# Patient Record
Sex: Female | Born: 1955 | Race: White | Hispanic: No | Smoking: Never smoker
Health system: Southern US, Community
[De-identification: ages and names within clinical notes are randomized; demographics above are authoritative.]

## PROBLEM LIST (undated history)

## (undated) DIAGNOSIS — M797 Fibromyalgia: Secondary | ICD-10-CM

## (undated) DIAGNOSIS — M1990 Unspecified osteoarthritis, unspecified site: Secondary | ICD-10-CM

## (undated) DIAGNOSIS — Z8489 Family history of other specified conditions: Secondary | ICD-10-CM

## (undated) DIAGNOSIS — F909 Attention-deficit hyperactivity disorder, unspecified type: Secondary | ICD-10-CM

## (undated) DIAGNOSIS — F419 Anxiety disorder, unspecified: Secondary | ICD-10-CM

## (undated) DIAGNOSIS — K219 Gastro-esophageal reflux disease without esophagitis: Secondary | ICD-10-CM

## (undated) DIAGNOSIS — F329 Major depressive disorder, single episode, unspecified: Secondary | ICD-10-CM

## (undated) DIAGNOSIS — M542 Cervicalgia: Secondary | ICD-10-CM

## (undated) DIAGNOSIS — M79604 Pain in right leg: Secondary | ICD-10-CM

## (undated) DIAGNOSIS — F32A Depression, unspecified: Secondary | ICD-10-CM

## (undated) DIAGNOSIS — M79605 Pain in left leg: Secondary | ICD-10-CM

## (undated) DIAGNOSIS — D649 Anemia, unspecified: Secondary | ICD-10-CM

## (undated) DIAGNOSIS — I10 Essential (primary) hypertension: Secondary | ICD-10-CM

## (undated) HISTORY — DX: Pain in right leg: M79.604

## (undated) HISTORY — PX: OTHER SURGICAL HISTORY: SHX169

## (undated) HISTORY — PX: NECK SURGERY: SHX720

## (undated) HISTORY — DX: Pain in right leg: M79.605

## (undated) HISTORY — PX: VAGINAL HYSTERECTOMY: SHX2639

## (undated) HISTORY — PX: DILATION AND CURETTAGE OF UTERUS: SHX78

## (undated) HISTORY — PX: ABDOMINAL HYSTERECTOMY: SHX81

## (undated) HISTORY — PX: TONSILLECTOMY: SUR1361

## (undated) HISTORY — PX: KNEE SURGERY: SHX244

---

## 1998-03-29 ENCOUNTER — Other Ambulatory Visit: Admission: RE | Admit: 1998-03-29 | Discharge: 1998-03-29 | Payer: Self-pay | Admitting: Obstetrics and Gynecology

## 1998-04-12 ENCOUNTER — Ambulatory Visit (HOSPITAL_COMMUNITY): Admission: RE | Admit: 1998-04-12 | Discharge: 1998-04-12 | Payer: Self-pay | Admitting: Obstetrics and Gynecology

## 1998-04-17 ENCOUNTER — Ambulatory Visit (HOSPITAL_COMMUNITY): Admission: RE | Admit: 1998-04-17 | Discharge: 1998-04-17 | Payer: Self-pay | Admitting: Obstetrics and Gynecology

## 1999-04-09 ENCOUNTER — Other Ambulatory Visit: Admission: RE | Admit: 1999-04-09 | Discharge: 1999-04-09 | Payer: Self-pay | Admitting: Obstetrics and Gynecology

## 1999-06-07 ENCOUNTER — Ambulatory Visit (HOSPITAL_COMMUNITY): Admission: RE | Admit: 1999-06-07 | Discharge: 1999-06-07 | Payer: Self-pay | Admitting: Obstetrics and Gynecology

## 1999-07-01 ENCOUNTER — Encounter: Admission: RE | Admit: 1999-07-01 | Discharge: 1999-07-18 | Payer: Self-pay | Admitting: Internal Medicine

## 1999-10-21 ENCOUNTER — Encounter: Admission: RE | Admit: 1999-10-21 | Discharge: 1999-10-21 | Payer: Self-pay | Admitting: Orthopedic Surgery

## 1999-11-07 ENCOUNTER — Ambulatory Visit (HOSPITAL_COMMUNITY): Admission: RE | Admit: 1999-11-07 | Discharge: 1999-11-07 | Payer: Self-pay | Admitting: Obstetrics and Gynecology

## 1999-11-07 ENCOUNTER — Encounter: Payer: Self-pay | Admitting: Obstetrics and Gynecology

## 1999-11-29 ENCOUNTER — Encounter: Payer: Self-pay | Admitting: Neurosurgery

## 1999-11-29 ENCOUNTER — Ambulatory Visit (HOSPITAL_COMMUNITY): Admission: RE | Admit: 1999-11-29 | Discharge: 1999-11-29 | Payer: Self-pay | Admitting: Neurosurgery

## 1999-12-17 ENCOUNTER — Ambulatory Visit (HOSPITAL_COMMUNITY): Admission: RE | Admit: 1999-12-17 | Discharge: 1999-12-17 | Payer: Self-pay | Admitting: Neurosurgery

## 1999-12-17 ENCOUNTER — Encounter: Payer: Self-pay | Admitting: Neurosurgery

## 2000-01-01 ENCOUNTER — Encounter: Payer: Self-pay | Admitting: Neurosurgery

## 2000-01-01 ENCOUNTER — Ambulatory Visit (HOSPITAL_COMMUNITY): Admission: RE | Admit: 2000-01-01 | Discharge: 2000-01-01 | Payer: Self-pay | Admitting: Neurosurgery

## 2000-01-14 ENCOUNTER — Ambulatory Visit (HOSPITAL_COMMUNITY): Admission: RE | Admit: 2000-01-14 | Discharge: 2000-01-14 | Payer: Self-pay | Admitting: Neurosurgery

## 2000-01-14 ENCOUNTER — Encounter: Payer: Self-pay | Admitting: Neurosurgery

## 2000-01-28 ENCOUNTER — Ambulatory Visit (HOSPITAL_COMMUNITY): Admission: RE | Admit: 2000-01-28 | Discharge: 2000-01-28 | Payer: Self-pay | Admitting: Internal Medicine

## 2000-01-28 ENCOUNTER — Encounter: Payer: Self-pay | Admitting: Internal Medicine

## 2000-06-04 ENCOUNTER — Ambulatory Visit (HOSPITAL_COMMUNITY): Admission: RE | Admit: 2000-06-04 | Discharge: 2000-06-04 | Payer: Self-pay | Admitting: Neurosurgery

## 2000-06-04 ENCOUNTER — Encounter: Payer: Self-pay | Admitting: Neurosurgery

## 2000-06-24 ENCOUNTER — Encounter: Payer: Self-pay | Admitting: Neurosurgery

## 2000-06-24 ENCOUNTER — Ambulatory Visit (HOSPITAL_COMMUNITY): Admission: RE | Admit: 2000-06-24 | Discharge: 2000-06-24 | Payer: Self-pay | Admitting: Neurosurgery

## 2000-06-25 ENCOUNTER — Emergency Department (HOSPITAL_COMMUNITY): Admission: EM | Admit: 2000-06-25 | Discharge: 2000-06-25 | Payer: Self-pay | Admitting: Emergency Medicine

## 2000-07-14 ENCOUNTER — Ambulatory Visit (HOSPITAL_COMMUNITY): Admission: RE | Admit: 2000-07-14 | Discharge: 2000-07-14 | Payer: Self-pay | Admitting: Neurosurgery

## 2000-07-14 ENCOUNTER — Encounter: Payer: Self-pay | Admitting: Neurosurgery

## 2000-09-17 ENCOUNTER — Encounter: Payer: Self-pay | Admitting: *Deleted

## 2000-09-17 ENCOUNTER — Ambulatory Visit (HOSPITAL_COMMUNITY): Admission: RE | Admit: 2000-09-17 | Discharge: 2000-09-17 | Payer: Self-pay | Admitting: *Deleted

## 2000-12-08 ENCOUNTER — Ambulatory Visit (HOSPITAL_COMMUNITY): Admission: RE | Admit: 2000-12-08 | Discharge: 2000-12-08 | Payer: Self-pay | Admitting: Obstetrics and Gynecology

## 2000-12-08 ENCOUNTER — Encounter: Payer: Self-pay | Admitting: Obstetrics and Gynecology

## 2001-01-19 ENCOUNTER — Ambulatory Visit (HOSPITAL_COMMUNITY): Admission: RE | Admit: 2001-01-19 | Discharge: 2001-01-19 | Payer: Self-pay | Admitting: Neurosurgery

## 2001-01-19 ENCOUNTER — Encounter: Payer: Self-pay | Admitting: Neurosurgery

## 2001-05-04 ENCOUNTER — Encounter: Payer: Self-pay | Admitting: Specialist

## 2001-05-04 ENCOUNTER — Ambulatory Visit (HOSPITAL_COMMUNITY): Admission: RE | Admit: 2001-05-04 | Discharge: 2001-05-05 | Payer: Self-pay | Admitting: Specialist

## 2001-06-17 ENCOUNTER — Ambulatory Visit (HOSPITAL_COMMUNITY): Admission: RE | Admit: 2001-06-17 | Discharge: 2001-06-17 | Payer: Self-pay | Admitting: Specialist

## 2001-06-17 ENCOUNTER — Encounter: Payer: Self-pay | Admitting: Specialist

## 2001-10-15 ENCOUNTER — Other Ambulatory Visit: Admission: RE | Admit: 2001-10-15 | Discharge: 2001-10-15 | Payer: Self-pay | Admitting: Obstetrics and Gynecology

## 2002-08-12 ENCOUNTER — Ambulatory Visit (HOSPITAL_COMMUNITY): Admission: RE | Admit: 2002-08-12 | Discharge: 2002-08-12 | Payer: Self-pay | Admitting: Obstetrics and Gynecology

## 2002-08-12 ENCOUNTER — Encounter: Payer: Self-pay | Admitting: Obstetrics and Gynecology

## 2003-03-29 ENCOUNTER — Other Ambulatory Visit: Admission: RE | Admit: 2003-03-29 | Discharge: 2003-03-29 | Payer: Self-pay | Admitting: Obstetrics and Gynecology

## 2003-10-20 ENCOUNTER — Encounter: Admission: RE | Admit: 2003-10-20 | Discharge: 2003-10-20 | Payer: Self-pay | Admitting: Neurosurgery

## 2003-11-07 ENCOUNTER — Encounter: Admission: RE | Admit: 2003-11-07 | Discharge: 2003-11-07 | Payer: Self-pay | Admitting: Neurosurgery

## 2003-11-29 ENCOUNTER — Encounter: Admission: RE | Admit: 2003-11-29 | Discharge: 2003-11-29 | Payer: Self-pay | Admitting: Neurosurgery

## 2004-01-10 ENCOUNTER — Ambulatory Visit (HOSPITAL_COMMUNITY): Admission: RE | Admit: 2004-01-10 | Discharge: 2004-01-10 | Payer: Self-pay | Admitting: Family Medicine

## 2006-04-01 ENCOUNTER — Encounter: Payer: Self-pay | Admitting: Orthopedic Surgery

## 2009-09-14 ENCOUNTER — Ambulatory Visit (HOSPITAL_COMMUNITY): Admission: RE | Admit: 2009-09-14 | Discharge: 2009-09-14 | Payer: Self-pay | Admitting: Family Medicine

## 2011-04-18 NOTE — Op Note (Signed)
[employer]. [facility]  Patient:    Whitney Gregory, Whitney Gregory                     MRN: [id number] Proc. Date: 06/04/00 Adm. Date:  [id number] Attending:  Gerald Dexter                           Operative Report  PREOPERATIVE DIAGNOSIS:  Herniated disk with spondylosis, C5-6.  POSTOPERATIVE DIAGNOSIS:  Herniated disk with spondylosis, C5-6.  PROCEDURE:  C5-6 anterior cervical diskectomy with tibial bone bank fusion with the operating microscope.  SURGEON:  Reinaldo Meeker, M.D.  ASSISTANT:  Donzetta Sprung. Roney Jaffe., M.D.  PROCEDURE IN DETAIL:  After being placed in the supine position with five pounds of halter traction, the patients neck was prepped and draped in the usual sterile fashion.  A localizing x-ray was taken prior to incision to identify the appropriate level.  A transverse incision was made at the right anterior neck starting at the midline, headed toward the medial aspect of the sternocleidomastoid muscle.  The platysma muscle was then incised transversely.  The natural fascial plane between the strap muscles medially and sternocleidomastoid laterally was identified and followed down to the anterior aspect of the cervical spine.  The longus colli muscles were identified, split in the midline, stripped away bilaterally, _____ Art therapist.  A second x-ray was taken at this time, which showed to the C6-7 level.  Dissection was carried out one level higher at C5-6.  Self-retaining retractors were placed for exposure.  Using a 15 blade, the annulus of the disk was incised.  Using pituitary rongeurs and curettes, a thorough disk space clean-out was carried out.  Microscope was draped and brought into the field and used for the remainder of the case.  Using microdissection technique, the remainder of the disk material out of the posterior longitudinal ligament was removed.  Some bony overgrowth was also removed. Posterior longitudinal ligament was then  incised transversely, and the cut edges were removed with the Kerrison punch.  Proximal foraminal decompression of the C6 nerve roots bilaterally was then carried out until there was no further evidence of compression.  At this point, inspection was carried out in all directions for any evidence of residual compression, and none could be identified.  Large amounts of irrigation were carried out, any bleeding controlled with bipolar coagulation and Gelfoam.  Measurements were taken, and a 7 mm bone bank plug was reconstituted.  After irrigating once more and confirming hemostasis, the bone plug was impacted without difficulty.  A final x-ray was taken, which showed good placement of the bony plug.  At this point, irrigation was carried out once more and hemostasis was confirmed.  The wound was closed using interrupted Vicryl on the platysma muscle, inverted 5-0 PDS in the subcuticular layer, and staples on the skin.  Sterile dressing was then applied.  The patient was reactivated and taken to the recovery room in stable condition. DD:  06/04/00 TD:  06/04/00 Job: [id number]

## 2011-04-18 NOTE — H&P (Signed)
[place]. [facility]  Patient:    Whitney Gregory, Whitney Gregory                    MRN: [id number] Adm. Date:  05/04/01 Attending:  Kerrin Champagne, M.D. Dictator:   Druscilla Brownie. Shela Nevin, P.A. CC:         Julieanne Manson, M.D. of [facility] [place]   History and Physical  DATE OF BIRTH:  Sep 02, 1956  CHIEF COMPLAINT:  "Pain in my neck and arms with tingling in my hands."  PRESENT ILLNESS:  This is 55 year old white female who is seen by [place] for continuing problems concerning her cervical spine.  She had an anterior diskectomy and fusion performed in 2001 but had continuing and residual pain into her neck and into her right shoulder.  She now has intermittent numbness in the hands.  Her previous surgery was by Dr. Aliene Beams at the C6-7 level.  She has a constant aching discomfort in the cervical spine and in the shoulder area.  The patient has limitation to range of motion with forward bending of the cervical spine is two fingerbreadths from the sternum. Extension is only about 30 degrees.  She has diminished lateral bending and rotation as well.  No motor deficits in the upper extremities.  X-rays showed attempted fusion at C5-6.  It was felt this patient would need a repeat anterior diskectomy and fusion using allograft.  She is scheduled for rearthrodesis at C5-6.  PAST MEDICAL HISTORY:  She has had a tonsillectomy, adenoidectomy, hysterectomy, and laparoscopic surgery in the past.  She currently is being checked closely for any type of hypertension by Dr. Delrae Alfred of [facility].  CURRENT MEDICATIONS: 1. Celexa 20 mg one q.d. 2. Temazepam 15 mg one h.s. 3. Alprazolam 0.5 mg one-half tablet p.r.n. 4. OxyContin 20 mg one t.i.d. for pain.  ALLERGIES:  SERZONE, which causes tachycardia.  FAMILY HISTORY:  Positive for hypertension.  SOCIAL HISTORY:  The patient has occasional glass of wine during the week.  No intake of tobacco  products.  REVIEW OF SYSTEMS:  CNS:  No seizure disorder, paralysis, double vision, but the patient does have radiculopathy from C5-6 in neck and upper extremities. CARDIOVASCULAR:  No chest pain, no angina, no orthopnea.  RESPIRATORY:  No productive cough, no hemoptysis, no shortness of breath.  GASTROINTESTINAL: No nausea, vomiting, melena, or blood stools.  GENITOURINARY:  No discharge, dysuria, or hematuria.  MUSCULOSKELETAL:  Primarily as in present illness with the cervical spine as mentioned above.  PHYSICAL EXAMINATION:  GENERAL:  Alert and cooperative, friendly, somewhat anxious 55 year old white female.  VITAL SIGNS:  Blood pressure 110/64, pulse 60, respirations 12.  HEENT:  Normocephalic. PERRLA, EOM intact.  Oropharynx is clear.  CHEST:  Clear to auscultation.  No rhonchi, no rales.  HEART:  Regular rate and rhythm.  No murmurs are heard.  ABDOMEN:  Soft, nontender.  Liver and spleen not felt.  GENITALIA/RECTAL/PELVIC/BREAST:  Not done, not pertinent to present illness.  EXTREMITIES:  Upper extremities as in present illness above.  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  PLAN:  The patient to undergo rearthrodesis C5-6 with allograft bone graft and ______ plates and screws. DD:  04/30/01 TD:  04/30/01 Job: [id number]

## 2011-04-18 NOTE — Op Note (Signed)
[place]. [facility]  Patient:    Whitney Gregory, Whitney Gregory                     MRN: [id number] Proc. Date: 05/04/01 Adm. Date:  [id number] Attending:  Lubertha South                           Operative Report  PREOPERATIVE DIAGNOSIS:  Pseudoarthrosis, C5-6, of an anterior cervical diskectomy and fusion site with kyphotic deformity and collapse of bone graft at this level.  POSTOPERATIVE DIAGNOSIS:  Pseudoarthrosis, C5-6, of an anterior cervical diskectomy and fusion site with kyphotic deformity and collapse of bone graft at this level.  PROCEDURES:  Takedown of C5-6 anterior cervical fusion and pseudoarthrosis and then re-arthrodesis using a 9 mm dense cancellous allograft bone graft and a 22 mm Spinal Concept plate with 13 mm screws.  SURGEON:  Kerrin Champagne, M.D.  ASSISTANT:  Georges Lynch. Darrelyn Hillock, M.D.  ANESTHESIA:  GOT, Kaylyn Layer. Michelle Piper, M.D.  ESTIMATED BLOOD LOSS:  50 cc.  DRAINS:  Foley to straight drain.  BRIEF CLINICAL HISTORY:  This patient is a 55 year old female who had a large C5-6 disk protrusion almost a year ago and was treated with anterior cervical diskectomy and fusion at the C5-6 level with allograft bone graft, without fixation.  She eventually went on to collapse her bone graft and develop pseudoarthrosis.  She has had persistent neck and shoulder pain since the time of surgery.  Numbness into her arms has improved immediately following her surgery.  The patient is brought to the operating room to undergo a re-arthrodesis, and this is to be done from the anterior aspect of the cervical spine in order to restore the normal lordotic curve due to bone graft collapse and a kyphotic deformity that has developed at the fusion site.  The patient has had persistent neck and shoulder pain since the time of her surgery requiring narcotic medications.  INTRAOPERATIVE FINDINGS:  The patient was found to have definite motion at the previous  fusion site at C5-6, collapse of the fusion graft, and an intraoperative appearance of kyphosis.  DESCRIPTION OF PROCEDURE:  After adequate general anesthesia, patient in beach chair position, the neck in slight extension with five pounds cervical Holter traction.  Standard prep with Duraprep solution over the anterior aspect of the neck on the right side.  Draped in the usual manner.  An iodine-impregnated Vi-Drape.  Standard preoperative antibiotics.  The incision through the old previous incision scar along the right side of the neck after infiltration with Marcaine 0.5% with 1:200,000 epinephrine.  Carried sharply through skin and subcutaneous layers down to the platysmal layer.  This was incised in line with the skin incision.  The incision was approximately 2-1/2 to 3 inches in length.  The interval between the trachea and esophagus medially and the carotid sheath laterally was then developed using blunt finger dissection to the anterior aspect of the cervical spine.  Exposure was excellent.  The medial border of longus colli muscle identified. Electrocautery then used to incise the prevertebral fascia along the medial border of the longus colli muscle, and the Key elevator then used to tease the prevertebral fascia across the midline, and the anterior aspect of the cervical spine was exposed.  Spinal needles were then placed with the sheath intact, only allowing a centimeter of the needle to protrude.  These were inserted into the disk space at  C6-7 and at C5-6.  Intraoperative radiograph obtained, which demonstrated the needle at the 5-6 level above and at 6-7 below.  Medial border of the longus colli muscle was then carefully freed up using a Art therapist.  A McCullough retractor was inserted.  This was done after removal of the spinal needles.  The upper needle after its removal was marked using the pituitary rongeur, removing bone graft from the C5-6 level. McCullough retractor  inserted without difficulty.  Drill holes then placed into the vertebral body of C5 and C6 anteriorly and distraction using Caspar distraction system.  Another operative radiograph was obtained in order to ensure that the proper disk space was present and then the disk space mobilized between the distraction screws nicely.  This was found to be the case at C5-6.  High-speed bur was then used to remove the fibrous area of the fusion and pseudoarthrosis at C5-6.  The graft was taken down and the superior cortical bone debrided off the end plate at C6 and off the inferior aspect of C5.  This was taken all the way back to the spinal canal.  When the graft was removed, then a 1 mm Kerrison could be introduced.  The posterior inferior lip of C5 was resected, removing a bony block within the spinal canal and decompressing the neural foramen, both the left side and the right side, removing uncovertebral spurs from the bilaterally.  Additionally, the uncovertebral joint on the left side involving the superior posterior lateral lip of C6 was resected, opening the neural foramen for the left C6 nerve root and similarly on the right side, opening the neural foramen for the C6 nerve root.  When this was completed, then irrigation was performed. High-speed bur was used to carefully bur the end plate superiorly inferiorly to establish an excellent bed for bone graft.  The depth of the intervertebral disk space was measured and measured about 15 mm.  Sounders were then used to sound the disk for height.  Initially an 8 mm, then a 9 mm sounder was inserted, and this appeared to be the best fit.  A 9 mm long graft was then brought onto the field and reactivated in saline solution.  Gelfoam, thrombin-soaked, was used to obtain hemostasis over the bleeding end plates both inferiorly and superiorly.  When this was adequately hemostased, then irrigation was performed.  Examination of the intervertebral disk  space demonstrated no active bleeding present.  Graft was then impacted into place and the distraction system then removed after the graft was countersunk about  1 mm.  The screw posts were removed at the C5 vertebral body and at C6.  Next, the high-speed bur was used to carefully bur the anterior lip of the end plate at Z6-1 and the intervertebral disk space at C5-6 anteriorly to accept the plate.  Bone wax was applied to the bleeding screw holes from the previous posts.  Carefully, the expected distance for the new plate was measured using a caliper.  A 24 mm plate was chosen.  The plate was then placed over the anterior aspect of the cervical spine using a plate holder and approximated the previous area of insertion of the patients distraction screws. Five-pound cervical Holter traction was released.  The plate was then impacted into place.  Then using a drill sleeve, 13 mm screws were then placed on the left side in the C5 vertebral body, the upper right screw hole, and then the lower right screw hole was similarly  drilled and the 13 mm screw placed. Regular screws were used.  These screws were then tightened into place after their initial application, ensuring the plate was in proper orientation in the AP plane.  After these screws were then placed, the inferior screw on the left side was then placed, and then the superior screw on the left side. Intraoperative radiograph was obtained, which demonstrated the screws and plates were in excellent position and alignment, graft distracting the disk space 9 mm, and restoring normal lordotic curve of the cervical spine. Irrigation was then performed.  The esophagus examined, and it had normal appearance.  Soft tissues allowed to fall back into place.  All area of bleeding was cauterized using bipolar electrocautery for the superficial layer of the incision.  The platysmal layer was then approximated with interrupted 3-0 Vicryl suture, the  deep layers of the skin approximated with interrupted 3-0 Vicryl sutures, and the skin closed with a running subcuticular stitch of 4-0 Vicryl.  Tincture of benzoin and Steri-Strips applied with a 4 x 4, affixed to the skin with Hypafix tape.  [place] collar applied.  Patient had a Foley catheter in place at the beginning of the case.  This was placed, and this was removed at the end of the case.  The patient was then reactivated and returned to the recovery room in satisfactory condition.  All instrument and sponge counts were correct. DD:  05/04/01 TD:  05/04/01 Job: [id number]

## 2011-06-21 ENCOUNTER — Emergency Department (HOSPITAL_BASED_OUTPATIENT_CLINIC_OR_DEPARTMENT_OTHER): Payer: Worker's Compensation

## 2011-06-21 ENCOUNTER — Emergency Department (HOSPITAL_BASED_OUTPATIENT_CLINIC_OR_DEPARTMENT_OTHER)
Admission: EM | Admit: 2011-06-21 | Discharge: 2011-06-21 | Disposition: A | Discharge status: Home / Self Care | Payer: Worker's Compensation | Attending: Emergency Medicine | Admitting: Emergency Medicine

## 2011-06-21 ENCOUNTER — Encounter: Payer: Self-pay | Admitting: Emergency Medicine

## 2011-06-21 DIAGNOSIS — Y9289 Other specified places as the place of occurrence of the external cause: Secondary | ICD-10-CM | POA: Insufficient documentation

## 2011-06-21 DIAGNOSIS — X58XXXA Exposure to other specified factors, initial encounter: Secondary | ICD-10-CM | POA: Insufficient documentation

## 2011-06-21 DIAGNOSIS — T148XXA Other injury of unspecified body region, initial encounter: Secondary | ICD-10-CM

## 2011-06-21 HISTORY — DX: Fibromyalgia: M79.7

## 2011-06-21 HISTORY — DX: Unspecified osteoarthritis, unspecified site: M19.90

## 2011-06-21 NOTE — ED Provider Notes (Signed)
History    pt injured her left knee at work when stepping off an object, felt a pop, able to walk but with pain--no prior h/o injury--pain better with rest--no tx used prior to arrival  Chief Complaint  Patient presents with  . Knee Injury   The history is provided by the patient.    Past Medical History  Diagnosis Date  . Fibromyalgia   . Arthritis     Past Surgical History  Procedure Date  . Abdominal hysterectomy   . Tonsillectomy   . Neck surgery     No family history on file.  History  Substance Use Topics  . Smoking status: Not on file  . Smokeless tobacco: Not on file  . Alcohol Use:     OB History    Grav Para Term Preterm Abortions TAB SAB Ect Mult Living                  Review of Systems  All other systems reviewed and are negative.    Physical Exam  BP 145/84  Pulse 66  Temp(Src) 98.5 F (36.9 C) (Oral)  Resp 18  SpO2 98%  Physical Exam  Nursing note and vitals reviewed. Constitutional: She is oriented to person, place, and time. She appears well-developed and well-nourished.  Non-toxic appearance.  HENT:  Head: Normocephalic and atraumatic.  Eyes: Conjunctivae are normal. Pupils are equal, round, and reactive to light.  Neck: Normal range of motion.  Cardiovascular: Normal rate.   Pulmonary/Chest: Effort normal.  Musculoskeletal:       Left knee: She exhibits no swelling, no effusion and no deformity.       Legs: Neurological: She is alert and oriented to person, place, and time.  Skin: Skin is warm and dry.  Psychiatric: She has a normal mood and affect.    ED Course  Procedures  MDM     Pt doesn't want the xray, crutches and knee brace given with ortho referral  Toy Baker, MD 06/21/11 0147

## 2011-06-21 NOTE — ED Notes (Signed)
Pt c/o of left knee pain after stepping on stool.

## 2012-03-31 DIAGNOSIS — Z96652 Presence of left artificial knee joint: Secondary | ICD-10-CM | POA: Insufficient documentation

## 2012-04-15 ENCOUNTER — Encounter (HOSPITAL_COMMUNITY): Payer: Self-pay | Admitting: Pharmacy Technician

## 2012-04-19 ENCOUNTER — Encounter (HOSPITAL_COMMUNITY)
Admission: RE | Admit: 2012-04-19 | Discharge: 2012-04-19 | Disposition: A | Discharge status: Home / Self Care | Payer: Worker's Compensation | Source: Ambulatory Visit | Attending: Orthopedic Surgery | Admitting: Orthopedic Surgery

## 2012-04-19 ENCOUNTER — Encounter (HOSPITAL_COMMUNITY): Payer: Self-pay

## 2012-04-19 HISTORY — DX: Depression, unspecified: F32.A

## 2012-04-19 HISTORY — DX: Anxiety disorder, unspecified: F41.9

## 2012-04-19 HISTORY — DX: Gastro-esophageal reflux disease without esophagitis: K21.9

## 2012-04-19 HISTORY — DX: Anemia, unspecified: D64.9

## 2012-04-19 HISTORY — DX: Major depressive disorder, single episode, unspecified: F32.9

## 2012-04-19 LAB — DIFFERENTIAL
Basophils Relative: 0 % (ref 0–1)
Eosinophils Absolute: 0.1 10*3/uL (ref 0.0–0.7)
Eosinophils Relative: 2 % (ref 0–5)
Lymphs Abs: 2.1 10*3/uL (ref 0.7–4.0)
Neutrophils Relative %: 52 % (ref 43–77)

## 2012-04-19 LAB — URINALYSIS, ROUTINE W REFLEX MICROSCOPIC
Bilirubin Urine: NEGATIVE
Hgb urine dipstick: NEGATIVE
Ketones, ur: NEGATIVE mg/dL
Nitrite: NEGATIVE
Specific Gravity, Urine: 1.018 (ref 1.005–1.030)
Urobilinogen, UA: 0.2 mg/dL (ref 0.0–1.0)

## 2012-04-19 LAB — CBC
MCH: 30.9 pg (ref 26.0–34.0)
MCHC: 33.5 g/dL (ref 30.0–36.0)
MCV: 92.3 fL (ref 78.0–100.0)
Platelets: 316 10*3/uL (ref 150–400)
RBC: 4.43 MIL/uL (ref 3.87–5.11)
RDW: 12.4 % (ref 11.5–15.5)

## 2012-04-19 LAB — BASIC METABOLIC PANEL
BUN: 23 mg/dL (ref 6–23)
CO2: 27 mEq/L (ref 19–32)
Calcium: 9.3 mg/dL (ref 8.4–10.5)
Creatinine, Ser: 0.6 mg/dL (ref 0.50–1.10)
GFR calc non Af Amer: >90 mL/min (ref >90)
Glucose, Bld: 101 mg/dL — ABNORMAL HIGH (ref 70–99)
Sodium: 138 mEq/L (ref 135–145)

## 2012-04-19 LAB — PROTIME-INR
INR: 0.89 (ref 0.00–1.49)
Prothrombin Time: 12.2 seconds (ref 11.6–15.2)

## 2012-04-19 LAB — SURGICAL PCR SCREEN: Staphylococcus aureus: NEGATIVE

## 2012-04-19 NOTE — Patient Instructions (Signed)
20 Whitney Gregory  04/19/2012   Your procedure is scheduled on:  04/27/12 400pm-530pm  Report to [facility] at 130pm  Call this number if you have problems the morning of surgery: [phone]   Remember:   Do not eat food:After Midnight.  May have clear liquids:until Midnight   Take these medicines the morning of surgery with A SIP OF WATER:    Do not wear jewelry.  Do not wear lotions, powders, or perfumes.   Do not shave 48 hours prior to surgery. Men may shave face and neck.  Do not bring valuables to the hospital.  Contacts, dentures or bridgework may not be worn into surgery.  Leave suitcase in the car. After surgery it may be brought to your room.  For patients admitted to the hospital, checkout time is 11:00 AM the day of discharge.      Special Instructions: CHG Shower Use Special Wash: 1/2 bottle night before surgery and 1/2 bottle morning of surgery. shower chin to toes with CHG.  Wash face and private parts with regular soap.    Please read over the following fact sheets that you were given: MRSA Information, coughing and deep breathing exercises, leg exercises, Incentive Spirometry Fact Sheet, Blood Transfusion Fact Sheet

## 2012-04-20 NOTE — Pre-Procedure Instructions (Signed)
04/20/12 Pt takes a dissolvable multivitamin she dissolves in water and drinks.  [facility] does not have this listed on medications. Clarified oxycontin dosage and wellbutrin dosage with [employer].

## 2012-04-20 NOTE — Pre-Procedure Instructions (Signed)
04/20/12 received clearance note from MD and placed on chart.

## 2012-04-21 NOTE — H&P (Signed)
Whitney Gregory is an 56 y.o. female.    Chief Complaint: Left knee medial compartment OA and pain   HPI: Pt is a 56 y.o. female complaining of medial aspect left knee pain since July 2012.  Pain started with an injury while at work. She had a previous arthroscopy which did nothing to relieve the pain.  Do to continued pain and failure of conservative treatments including steroid injections and Euflexxa injections she would like to proceed with more definitive treatment.  X-rays in the clinic showed arthritic changes and an MRI revealed a osteochondral defect on the medial aspect of the left knee.  Various options are discussed with the patient. Risks, benefits and expectations were discussed with the patient. Patient understand the risks, benefits and expectations and wishes to proceed with surgery.   PCP:  Cala Bradford, MD, MD  D/C Plans:  Home with HHPT  Post-op Meds:  Rx given for ASA, Robaxin, Celebrex, Iron, Colace and MiraLax  Tranexamic Acid:   To be given  Decadron:   To be given  PMH: Past Medical History  Diagnosis Date  . Fibromyalgia   . Arthritis   . Anemia     hx of   . GERD (gastroesophageal reflux disease)     occasional   . Anxiety   . Depression     PSH: Past Surgical History  Procedure Date  . Abdominal hysterectomy   . Tonsillectomy   . Neck surgery   . Dilation and curettage of uterus   . Other surgical history     lapararoscopies for endometriosis     Social History:  reports that she has never smoked. She has never used smokeless tobacco. She reports that she drinks about 1.2 ounces of alcohol per week. She reports that she does not use illicit drugs.  Allergies:  No Known Allergies  Medications: Current Outpatient Prescriptions  Medication Sig Dispense Refill  . ALPRAZolam (XANAX) 0.25 MG tablet Take 0.25 mg by mouth at bedtime as needed. For  Sleep       . Black Cohosh 540 MG CAPS Take 1 capsule by mouth at bedtime.      Marland Kitchen buPROPion  (WELLBUTRIN) 100 MG tablet Take 300 mg by mouth daily.       . celecoxib (CELEBREX) 200 MG capsule Take 200 mg by mouth at bedtime.      Marland Kitchen FLUoxetine (PROZAC) 20 MG capsule Take 20 mg by mouth daily with breakfast.       . oxyCODONE (OXYCONTIN) 10 MG 12 hr tablet Take 20 mg by mouth every 12 (twelve) hours.       . temazepam (RESTORIL) 22.5 MG capsule Take 22.5 mg by mouth at bedtime as needed. For sleep        ROS: Review of Systems  Constitutional: Positive for malaise/fatigue.  Eyes: Negative.   Respiratory: Negative.   Cardiovascular: Negative.   Gastrointestinal: Positive for heartburn.  Genitourinary: Positive for urgency and frequency.  Musculoskeletal: Positive for myalgias, back pain and joint pain.  Skin: Negative.   Neurological: Positive for headaches.  Endo/Heme/Allergies: Negative.   Psychiatric/Behavioral: The patient is nervous/anxious.      Physical Exam: Vitals: BP:139/86 ; HR: 100 ; Resp: 16 Physical Exam  Constitutional: She is oriented to person, place, and time and well-developed, well-nourished, and in no distress.  HENT:  Head: Normocephalic and atraumatic.  Nose: Nose normal.  Mouth/Throat: Oropharynx is clear and moist.  Eyes: Pupils are equal, round, and reactive to light.  Neck: Neck supple. No JVD present. No tracheal deviation present. No thyromegaly present.  Cardiovascular: Normal rate, regular rhythm, normal heart sounds and intact distal pulses.   Pulmonary/Chest: Effort normal and breath sounds normal. No respiratory distress. She has no wheezes. She exhibits no tenderness.  Abdominal: Soft. There is no tenderness. There is no guarding.  Musculoskeletal:       Left knee: She exhibits decreased range of motion (0-120), swelling and bony tenderness. She exhibits no effusion, no ecchymosis, no deformity and no laceration. tenderness found. Medial joint line tenderness noted. No lateral joint line and no patellar tendon tenderness noted.    Lymphadenopathy:    She has no cervical adenopathy.  Neurological: She is alert and oriented to person, place, and time.  Skin: Skin is warm and dry.  Psychiatric: Affect normal.     Assessment/Plan Assessment: Left knee medial compartment OA and pain   Plan: Patient will undergo a left knee medial unilateral knee arthroplasty on 04/27/2012. Risks benefits and expectation were discussed with the patient. Patient understand risks, benefits and expectation and wishes to proceed.   Anastasio Auerbach Rollo Farquhar   PAC  04/21/2012, 4:45 PM

## 2012-04-27 ENCOUNTER — Encounter (HOSPITAL_COMMUNITY): Payer: Self-pay

## 2012-04-27 ENCOUNTER — Encounter (HOSPITAL_COMMUNITY): Payer: Self-pay | Admitting: *Deleted

## 2012-04-27 ENCOUNTER — Encounter (HOSPITAL_COMMUNITY)
Admission: RE | Disposition: A | Discharge status: Home / Self Care | Payer: Self-pay | Source: Ambulatory Visit | Attending: Orthopedic Surgery

## 2012-04-27 ENCOUNTER — Encounter (HOSPITAL_COMMUNITY): Payer: Self-pay | Admitting: Anesthesiology

## 2012-04-27 ENCOUNTER — Observation Stay (HOSPITAL_COMMUNITY)
Admission: RE | Admit: 2012-04-27 | Discharge: 2012-04-28 | Disposition: A | Discharge status: Home / Self Care | Payer: Worker's Compensation | Source: Ambulatory Visit | Attending: Orthopedic Surgery | Admitting: Orthopedic Surgery

## 2012-04-27 ENCOUNTER — Ambulatory Visit (HOSPITAL_COMMUNITY): Payer: Worker's Compensation | Admitting: Anesthesiology

## 2012-04-27 DIAGNOSIS — R3915 Urgency of urination: Secondary | ICD-10-CM | POA: Insufficient documentation

## 2012-04-27 DIAGNOSIS — R51 Headache: Secondary | ICD-10-CM | POA: Insufficient documentation

## 2012-04-27 DIAGNOSIS — R5383 Other fatigue: Secondary | ICD-10-CM | POA: Insufficient documentation

## 2012-04-27 DIAGNOSIS — R350 Frequency of micturition: Secondary | ICD-10-CM | POA: Insufficient documentation

## 2012-04-27 DIAGNOSIS — Z79899 Other long term (current) drug therapy: Secondary | ICD-10-CM | POA: Insufficient documentation

## 2012-04-27 DIAGNOSIS — R5381 Other malaise: Secondary | ICD-10-CM | POA: Insufficient documentation

## 2012-04-27 DIAGNOSIS — K219 Gastro-esophageal reflux disease without esophagitis: Secondary | ICD-10-CM | POA: Insufficient documentation

## 2012-04-27 DIAGNOSIS — M549 Dorsalgia, unspecified: Secondary | ICD-10-CM | POA: Insufficient documentation

## 2012-04-27 DIAGNOSIS — Z01812 Encounter for preprocedural laboratory examination: Secondary | ICD-10-CM | POA: Insufficient documentation

## 2012-04-27 DIAGNOSIS — Z96652 Presence of left artificial knee joint: Secondary | ICD-10-CM

## 2012-04-27 DIAGNOSIS — M1710 Unilateral primary osteoarthritis, unspecified knee: Principal | ICD-10-CM | POA: Insufficient documentation

## 2012-04-27 DIAGNOSIS — IMO0001 Reserved for inherently not codable concepts without codable children: Secondary | ICD-10-CM | POA: Insufficient documentation

## 2012-04-27 HISTORY — PX: PARTIAL KNEE ARTHROPLASTY: SHX2174

## 2012-04-27 LAB — ABO/RH: ABO/RH(D): O NEG

## 2012-04-27 LAB — TYPE AND SCREEN: ABO/RH(D): O NEG

## 2012-04-27 SURGERY — ARTHROPLASTY, KNEE, UNICOMPARTMENTAL
Anesthesia: Spinal | Site: Knee | Laterality: Left | Wound class: Clean

## 2012-04-27 MED ORDER — METHOCARBAMOL 500 MG PO TABS
500.0000 mg | ORAL_TABLET | Freq: Four times a day (QID) | ORAL | Status: DC | PRN
  Filled 2012-04-27: qty 1

## 2012-04-27 MED ORDER — TEMAZEPAM 7.5 MG PO CAPS: 22.5000 mg | ORAL_CAPSULE | Freq: Every evening | ORAL | Status: DC | PRN

## 2012-04-27 MED ORDER — MENTHOL 3 MG MT LOZG
1.0000 | LOZENGE | OROMUCOSAL | Status: DC | PRN
  Filled 2012-04-27: qty 9

## 2012-04-27 MED ORDER — BUPROPION HCL 100 MG PO TABS
300.0000 mg | ORAL_TABLET | Freq: Every day | ORAL | Status: DC
  Administered 2012-04-28: 300 mg via ORAL
  Filled 2012-04-27: qty 3

## 2012-04-27 MED ORDER — BUPIVACAINE-EPINEPHRINE PF 0.25-1:200000 % IJ SOLN
INTRAMUSCULAR | Status: AC
  Filled 2012-04-27: qty 60

## 2012-04-27 MED ORDER — FENTANYL CITRATE 0.05 MG/ML IJ SOLN
INTRAMUSCULAR | Status: DC | PRN
  Administered 2012-04-27: 100 ug via INTRAVENOUS

## 2012-04-27 MED ORDER — CHLORHEXIDINE GLUCONATE 4 % EX LIQD
60.0000 mL | Freq: Once | CUTANEOUS | Status: DC
  Filled 2012-04-27: qty 60

## 2012-04-27 MED ORDER — PROPOFOL 10 MG/ML IV EMUL
INTRAVENOUS | Status: DC | PRN
  Administered 2012-04-27: 120 ug/kg/min via INTRAVENOUS

## 2012-04-27 MED ORDER — METOCLOPRAMIDE HCL 5 MG/ML IJ SOLN: 5.0000 mg | Freq: Three times a day (TID) | INTRAMUSCULAR | Status: DC | PRN

## 2012-04-27 MED ORDER — ACETAMINOPHEN 325 MG PO TABS
650.0000 mg | ORAL_TABLET | Freq: Four times a day (QID) | ORAL | Status: DC | PRN
  Administered 2012-04-28 (×2): 650 mg via ORAL
  Filled 2012-04-27 (×2): qty 2

## 2012-04-27 MED ORDER — FLEET ENEMA 7-19 GM/118ML RE ENEM: 1.0000 | ENEMA | Freq: Once | RECTAL | Status: AC | PRN

## 2012-04-27 MED ORDER — DEXAMETHASONE SODIUM PHOSPHATE 10 MG/ML IJ SOLN
INTRAMUSCULAR | Status: DC | PRN
  Administered 2012-04-27: 10 mg via INTRAVENOUS

## 2012-04-27 MED ORDER — ACETAMINOPHEN 650 MG RE SUPP: 650.0000 mg | Freq: Four times a day (QID) | RECTAL | Status: DC | PRN

## 2012-04-27 MED ORDER — ONDANSETRON HCL 4 MG/2ML IJ SOLN: 4.0000 mg | Freq: Four times a day (QID) | INTRAMUSCULAR | Status: DC | PRN

## 2012-04-27 MED ORDER — PROMETHAZINE HCL 25 MG/ML IJ SOLN: 6.2500 mg | INTRAMUSCULAR | Status: DC | PRN

## 2012-04-27 MED ORDER — DOCUSATE SODIUM 100 MG PO CAPS
100.0000 mg | ORAL_CAPSULE | Freq: Two times a day (BID) | ORAL | Status: DC
  Administered 2012-04-27 – 2012-04-28 (×2): 100 mg via ORAL

## 2012-04-27 MED ORDER — ACETAMINOPHEN 10 MG/ML IV SOLN
INTRAVENOUS | Status: AC
  Filled 2012-04-27: qty 100

## 2012-04-27 MED ORDER — CEFAZOLIN SODIUM 1-5 GM-% IV SOLN
1.0000 g | INTRAVENOUS | Status: AC
  Administered 2012-04-27: 1 g via INTRAVENOUS

## 2012-04-27 MED ORDER — ALPRAZOLAM 0.25 MG PO TABS
0.2500 mg | ORAL_TABLET | Freq: Every evening | ORAL | Status: DC | PRN
  Administered 2012-04-27: 0.25 mg via ORAL
  Filled 2012-04-27: qty 1

## 2012-04-27 MED ORDER — POTASSIUM CHLORIDE 2 MEQ/ML IV SOLN
INTRAVENOUS | Status: DC
  Administered 2012-04-27: via INTRAVENOUS
  Filled 2012-04-27 (×3): qty 1000

## 2012-04-27 MED ORDER — HYDROMORPHONE HCL PF 1 MG/ML IJ SOLN
0.5000 mg | INTRAMUSCULAR | Status: DC | PRN
  Administered 2012-04-27 – 2012-04-28 (×2): 1 mg via INTRAVENOUS
  Filled 2012-04-27 (×2): qty 1

## 2012-04-27 MED ORDER — ACETAMINOPHEN 10 MG/ML IV SOLN
INTRAVENOUS | Status: DC | PRN
  Administered 2012-04-27: 1000 mg via INTRAVENOUS

## 2012-04-27 MED ORDER — ONDANSETRON HCL 4 MG PO TABS: 4.0000 mg | ORAL_TABLET | Freq: Four times a day (QID) | ORAL | Status: DC | PRN

## 2012-04-27 MED ORDER — RIVAROXABAN 10 MG PO TABS
10.0000 mg | ORAL_TABLET | ORAL | Status: DC
  Administered 2012-04-28: 10 mg via ORAL
  Filled 2012-04-27 (×2): qty 1

## 2012-04-27 MED ORDER — HYDROMORPHONE HCL PF 1 MG/ML IJ SOLN: 0.2500 mg | INTRAMUSCULAR | Status: DC | PRN

## 2012-04-27 MED ORDER — ONDANSETRON HCL 4 MG/2ML IJ SOLN
INTRAMUSCULAR | Status: DC | PRN
  Administered 2012-04-27: 4 mg via INTRAVENOUS

## 2012-04-27 MED ORDER — METHOCARBAMOL 100 MG/ML IJ SOLN
500.0000 mg | Freq: Four times a day (QID) | INTRAVENOUS | Status: DC | PRN
  Administered 2012-04-27 (×2): 500 mg via INTRAVENOUS
  Filled 2012-04-27 (×3): qty 5

## 2012-04-27 MED ORDER — CEFAZOLIN SODIUM 1-5 GM-% IV SOLN
1.0000 g | Freq: Four times a day (QID) | INTRAVENOUS | Status: AC
  Administered 2012-04-27 – 2012-04-28 (×3): 1 g via INTRAVENOUS
  Filled 2012-04-27 (×3): qty 50

## 2012-04-27 MED ORDER — BUPIVACAINE-EPINEPHRINE 0.25% -1:200000 IJ SOLN
INTRAMUSCULAR | Status: DC | PRN
  Administered 2012-04-27: 30 mL

## 2012-04-27 MED ORDER — CEFAZOLIN SODIUM 1-5 GM-% IV SOLN
INTRAVENOUS | Status: AC
  Filled 2012-04-27: qty 50

## 2012-04-27 MED ORDER — DIPHENHYDRAMINE HCL 25 MG PO CAPS: 25.0000 mg | ORAL_CAPSULE | Freq: Four times a day (QID) | ORAL | Status: DC | PRN

## 2012-04-27 MED ORDER — BUPIVACAINE HCL (PF) 0.5 % IJ SOLN
INTRAMUSCULAR | Status: DC | PRN
  Administered 2012-04-27: 3 mL

## 2012-04-27 MED ORDER — BISACODYL 5 MG PO TBEC: 5.0000 mg | DELAYED_RELEASE_TABLET | Freq: Every day | ORAL | Status: DC | PRN

## 2012-04-27 MED ORDER — LACTATED RINGERS IV SOLN: INTRAVENOUS | Status: DC

## 2012-04-27 MED ORDER — POLYETHYLENE GLYCOL 3350 17 G PO PACK: 17.0000 g | PACK | Freq: Two times a day (BID) | ORAL | Status: DC

## 2012-04-27 MED ORDER — KETOROLAC TROMETHAMINE 30 MG/ML IJ SOLN
INTRAMUSCULAR | Status: DC | PRN
  Administered 2012-04-27: 30 mg via INTRAVENOUS

## 2012-04-27 MED ORDER — FLUOXETINE HCL 20 MG PO CAPS
20.0000 mg | ORAL_CAPSULE | Freq: Every day | ORAL | Status: DC
  Administered 2012-04-28: 20 mg via ORAL
  Filled 2012-04-27 (×2): qty 1

## 2012-04-27 MED ORDER — MEPERIDINE HCL 50 MG/ML IJ SOLN: 6.2500 mg | INTRAMUSCULAR | Status: DC | PRN

## 2012-04-27 MED ORDER — KETOROLAC TROMETHAMINE 30 MG/ML IJ SOLN
INTRAMUSCULAR | Status: AC
  Filled 2012-04-27: qty 1

## 2012-04-27 MED ORDER — OXYCODONE HCL 5 MG PO TABS
5.0000 mg | ORAL_TABLET | ORAL | Status: DC
  Administered 2012-04-27: 5 mg via ORAL
  Administered 2012-04-27: 10 mg via ORAL
  Administered 2012-04-28: 15 mg via ORAL
  Administered 2012-04-28 (×2): 10 mg via ORAL
  Filled 2012-04-27 (×3): qty 2
  Filled 2012-04-27: qty 3
  Filled 2012-04-27: qty 1

## 2012-04-27 MED ORDER — FERROUS SULFATE 325 (65 FE) MG PO TABS
325.0000 mg | ORAL_TABLET | Freq: Three times a day (TID) | ORAL | Status: DC
  Administered 2012-04-28 (×2): 325 mg via ORAL
  Filled 2012-04-27 (×4): qty 1

## 2012-04-27 MED ORDER — ALUM & MAG HYDROXIDE-SIMETH 200-200-20 MG/5ML PO SUSP: 30.0000 mL | ORAL | Status: DC | PRN

## 2012-04-27 MED ORDER — METOCLOPRAMIDE HCL 10 MG PO TABS: 5.0000 mg | ORAL_TABLET | Freq: Three times a day (TID) | ORAL | Status: DC | PRN

## 2012-04-27 MED ORDER — PHENOL 1.4 % MT LIQD
1.0000 | OROMUCOSAL | Status: DC | PRN
  Filled 2012-04-27: qty 177

## 2012-04-27 MED ORDER — MIDAZOLAM HCL 5 MG/5ML IJ SOLN
INTRAMUSCULAR | Status: DC | PRN
  Administered 2012-04-27: 2 mg via INTRAVENOUS
  Administered 2012-04-27 (×2): 1 mg via INTRAVENOUS

## 2012-04-27 MED ORDER — LACTATED RINGERS IV SOLN
INTRAVENOUS | Status: DC
  Administered 2012-04-27: 1000 mL via INTRAVENOUS
  Administered 2012-04-27 (×2): via INTRAVENOUS

## 2012-04-27 MED ORDER — KETAMINE HCL 10 MG/ML IJ SOLN
INTRAMUSCULAR | Status: DC | PRN
  Administered 2012-04-27 (×4): 10 mg via INTRAVENOUS

## 2012-04-27 SURGICAL SUPPLY — 52 items
ADH SKN CLS APL DERMABOND .7 (GAUZE/BANDAGES/DRESSINGS) ×1
BAG SPEC THK2 15X12 ZIP CLS (MISCELLANEOUS) ×1
BAG ZIPLOCK 12X15 (MISCELLANEOUS) ×2 IMPLANT
BANDAGE ELASTIC 6 VELCRO ST LF (GAUZE/BANDAGES/DRESSINGS) ×2 IMPLANT
BANDAGE ESMARK 6X9 LF (GAUZE/BANDAGES/DRESSINGS) ×1 IMPLANT
BLADE SAW RECIPROCATING 77.5 (BLADE) ×2 IMPLANT
BLADE SAW SGTL 13.0X1.19X90.0M (BLADE) ×2 IMPLANT
BNDG CMPR 9X6 STRL LF SNTH (GAUZE/BANDAGES/DRESSINGS) ×1
BNDG ESMARK 6X9 LF (GAUZE/BANDAGES/DRESSINGS) ×2
BOWL SMART MIX CTS (DISPOSABLE) ×2 IMPLANT
CEMENT HV SMART SET (Cement) ×2 IMPLANT
CLOTH BEACON ORANGE TIMEOUT ST (SAFETY) ×2 IMPLANT
COVER SURGICAL LIGHT HANDLE (MISCELLANEOUS) ×2 IMPLANT
CUFF TOURN SGL QUICK 34 (TOURNIQUET CUFF) ×2
CUFF TRNQT CYL 34X4X40X1 (TOURNIQUET CUFF) ×1 IMPLANT
DERMABOND ADVANCED (GAUZE/BANDAGES/DRESSINGS) ×1
DERMABOND ADVANCED .7 DNX12 (GAUZE/BANDAGES/DRESSINGS) ×1 IMPLANT
DRAPE EXTREMITY T 121X128X90 (DRAPE) ×2 IMPLANT
DRAPE POUCH INSTRU U-SHP 10X18 (DRAPES) ×2 IMPLANT
DRSG AQUACEL AG ADV 3.5X 6 (GAUZE/BANDAGES/DRESSINGS) ×2 IMPLANT
DRSG TEGADERM 4X4.75 (GAUZE/BANDAGES/DRESSINGS) ×2 IMPLANT
DURAPREP 26ML APPLICATOR (WOUND CARE) ×2 IMPLANT
ELECT REM PT RETURN 9FT ADLT (ELECTROSURGICAL) ×2
ELECTRODE REM PT RTRN 9FT ADLT (ELECTROSURGICAL) ×1 IMPLANT
EVACUATOR 1/8 PVC DRAIN (DRAIN) ×2 IMPLANT
FACESHIELD LNG OPTICON STERILE (SAFETY) ×8 IMPLANT
GAUZE SPONGE 2X2 8PLY STRL LF (GAUZE/BANDAGES/DRESSINGS) ×1 IMPLANT
GLOVE BIOGEL PI IND STRL 7.5 (GLOVE) ×1 IMPLANT
GLOVE BIOGEL PI IND STRL 8 (GLOVE) IMPLANT
GLOVE BIOGEL PI INDICATOR 7.5 (GLOVE) ×1
GLOVE BIOGEL PI INDICATOR 8 (GLOVE)
GLOVE ORTHO TXT STRL SZ7.5 (GLOVE) ×4 IMPLANT
GOWN BRE IMP PREV XXLGXLNG (GOWN DISPOSABLE) ×4 IMPLANT
GOWN STRL NON-REIN LRG LVL3 (GOWN DISPOSABLE) ×2 IMPLANT
IMMOBILIZER KNEE 20 (SOFTGOODS) ×2
IMMOBILIZER KNEE 20 THIGH 36 (SOFTGOODS) IMPLANT
KIT BASIN OR (CUSTOM PROCEDURE TRAY) ×2 IMPLANT
LEGGING LITHOTOMY PAIR STRL (DRAPES) ×2 IMPLANT
MANIFOLD NEPTUNE II (INSTRUMENTS) ×2 IMPLANT
NDL SAFETY ECLIPSE 18X1.5 (NEEDLE) ×1 IMPLANT
NEEDLE HYPO 18GX1.5 SHARP (NEEDLE) ×2
PACK TOTAL JOINT (CUSTOM PROCEDURE TRAY) ×2 IMPLANT
POSITIONER SURGICAL ARM (MISCELLANEOUS) ×2 IMPLANT
SPONGE GAUZE 2X2 STER 10/PKG (GAUZE/BANDAGES/DRESSINGS) ×1
SUCTION FRAZIER TIP 10 FR DISP (SUCTIONS) ×2 IMPLANT
SUT MNCRL AB 4-0 PS2 18 (SUTURE) ×2 IMPLANT
SUT VIC AB 1 CT1 36 (SUTURE) ×4 IMPLANT
SUT VIC AB 2-0 CT1 27 (SUTURE) ×4
SUT VIC AB 2-0 CT1 TAPERPNT 27 (SUTURE) ×2 IMPLANT
SYR 50ML LL SCALE MARK (SYRINGE) ×2 IMPLANT
TOWEL OR 17X26 10 PK STRL BLUE (TOWEL DISPOSABLE) ×4 IMPLANT
TRAY FOLEY CATH 14FRSI W/METER (CATHETERS) ×2 IMPLANT

## 2012-04-27 NOTE — Anesthesia Preprocedure Evaluation (Addendum)
Anesthesia Evaluation  Patient identified by MRN, date of birth, ID band Patient awake    Reviewed: Allergy & Precautions, H&P , NPO status , Patient's Chart, lab work & pertinent test results  Airway Mallampati: II TM Distance: >3 FB Neck ROM: Full    Dental No notable dental hx.    Pulmonary neg pulmonary ROS,  breath sounds clear to auscultation  Pulmonary exam normal       Cardiovascular negative cardio ROS  Rhythm:Regular Rate:Normal     Neuro/Psych PSYCHIATRIC DISORDERS Anxiety Depression  Neuromuscular disease negative neurological ROS  negative psych ROS   GI/Hepatic negative GI ROS, Neg liver ROS,   Endo/Other  negative endocrine ROS  Renal/GU negative Renal ROS  negative genitourinary   Musculoskeletal negative musculoskeletal ROS (+) Fibromyalgia -  Abdominal   Peds negative pediatric ROS (+)  Hematology negative hematology ROS (+)   Anesthesia Other Findings   Reproductive/Obstetrics negative OB ROS                           Anesthesia Physical Anesthesia Plan  ASA: II  Anesthesia Plan: Spinal   Post-op Pain Management:    Induction:   Airway Management Planned:   Additional Equipment:   Intra-op Plan:   Post-operative Plan:   Informed Consent: I have reviewed the patients History and Physical, chart, labs and discussed the procedure including the risks, benefits and alternatives for the proposed anesthesia with the patient or authorized representative who has indicated his/her understanding and acceptance.   Dental advisory given  Plan Discussed with: CRNA  Anesthesia Plan Comments:        Anesthesia Quick Evaluation

## 2012-04-27 NOTE — Op Note (Signed)
NAME: Whitney Gregory    MEDICAL RECORD NO.: [id number]   FACILITY: [facility]   DATE OF BIRTH: Jun 30, 1956  PHYSICIAN: Madlyn Frankel. Charlann Boxer, M.D.    DATE OF PROCEDURE: 04/27/2012    OPERATIVE REPORT   PREOPERATIVE DIAGNOSIS: Left knee medial compartment osteoarthritis.   POSTOPERATIVE DIAGNOSIS: Left knee medial compartment osteoarthritis.  PROCEDURE: Left partial knee replacement utilizing Biomet Oxford knee  component, size small femur, a left medial size AA tibial tray with a size 4 insert.   SURGEON: Madlyn Frankel. Charlann Boxer, M.D.   ASSISTANT: Lanney Gins, PAC.  Please note that Mr. Whitney Gregory was present for the entirety of the case,  utilized for preoperative positioning, perioperative retractor  management, general facilitation of the case and primary wound closure.   ANESTHESIA: spinal.   SPECIMENS: None.   COMPLICATIONS: None.  DRAINS: 1 medium HV   TOURNIQUET TIME: 41 minutes at 250 mmHg.   INDICATIONS FOR PROCEDURE: The patient is a 56 yo female referred for evaluation of left knee pain.  They presented with primary complaints of pain on the medial side of their knee. Radiographs revealed advanced medial compartment arthritis with specifically an antero-medial wear pattern.  There was bone on bone changes noted with subchondral sclerosis and osteophytes present as well as an osteochondral defect within the medial femoral condyle. The patient has had progressive problems failing to respond to conservative measures of medications, injections and activity modification. Risks of infection, DVT, component failure, need for future revision surgery were all discussed and reviewed.  Consent was obtained for benefit of pain relief.   PROCEDURE IN DETAIL: The patient was brought to the operative theater.  Once adequate anesthesia, preoperative antibiotics, 2gm Ancef administered, the patient was positioned in supine position with a left thigh tourniquet  placed. The left lower extremity was  prepped and draped in sterile  fashion with the leg on the Oxford leg holder.  The leg was allowed to flex to 120 degrees. A time-out  was performed identifying the patient, planned procedure, and extremity.  The leg was exsanguinated, tourniquet elevated to 250 mmHg. A midline  incision was made from the proximal pole of the patella to the tibial tubercle. A  soft tissue plane was created and partial median arthrotomy was then  made to allow for subluxation of the patella. Following initial synovectomy and  debridement, the osteophytes were removed off the medial aspect of the  knee.   Attention was first directed to the tibia. The tibial  extramedullary guide was positioned over the anterior crest of the tibia  and pinned into position, and using a measured resection guide from the  Oxford system, a 4 mm resection was made off the proximal tibia. First  the reciprocating saw along the medial aspect of the tibial spines, then the oscillating saw.    At this point, I sized this cut surface seem to be best fit for a size AA tibial tray.  With the retractors out of the wound and the knee held at 90 degrees the 4 feeler gauge had appropriate tension on the medial ligament.   At this point, the femoral canal was opened with a drill and the  intramedullary rod passed. Then using the guide for a small posterior resection off  the posterior aspect of the femur was positioned over the mid portion of the medial femoral condyle.  The orientation was set using the guide that mates the femoral guide to the intramedullary rod.  The 2 drill holes were made  into the distal femur.  The posterior guide was then impacted into place and the posterior  femoral cut made.  At this point, I milled the distal femur with a size 4 spigot in place. At this point, we did a trial reduction of the small femur, size AA tibial tray and a 4mm insert. At 90 degrees of  flexion and at 20 degrees of flexion the knee had  symmetric tension on  the ligaments.   Given these findings, the trial femoral component was removed. Final preparation of tibia was carried out by pinning it in position. Then  using a reciprocating saw I removed bone for the keel. Further bone was  removed with an osteotome.  Trial reduction was now carried out with the small femur, the AA left medial keeled tibia, and a 4 lollipop insert. The balance of the  ligaments appeared to be symmetric at 20 degrees and 90 degrees. Given  all these findings, the trial components were removed.   Cement was mixed. The final components were opened. The knee was irrigated with  normal saline solution. Then final debridements of the  soft tissue was carried out, I also drilled the sclerotic bone with a drill.  The final components were cemented with a single batch of cement in a  two-stage technique with the tibial component cemented first. The knee  was then brought  to 45 degrees of flexion with a 4 feeler gauge, held with pressure for a minute and half.  After this the femoral component was cemented in place.  The knee was again held at 45 degrees of flexion while the cement fully cured.  Excess cement was removed throughout the knee. Tourniquet was let down  after 41 minutes. After the cement had fully cured and excessive cement  was removed throughout the knee there was no visualized cement present.   The final size 4 insert was chosen and snapped into position. We re-irrigated  the knee. I placed a medium Hemovac drain deep. The extensor mechanism  was then reapproximated using a #1 Vicryl with the knee in flexion. The  remaining wound was closed with 2-0 Vicryl and a running 4-0 Monocryl.  The knee was cleaned, dried, and dressed sterilely using Dermabond and  Aquacel dressing. The drain site was dressed separately. The patient  was brought to the recovery room, Ace wrap in place, tolerating the  procedure well. He will be in the hospital for  overnight observation.  We will initiate physical therapy and progress to ambulate.     Madlyn Frankel Charlann Boxer, M.D.

## 2012-04-27 NOTE — Transfer of Care (Signed)
Immediate Anesthesia Transfer of Care Note  Patient: Whitney Gregory  Procedure(s) Performed: Procedure(s) (LRB): UNICOMPARTMENTAL KNEE (Left)  Patient Location: PACU  Anesthesia Type: Spinal  Level of Consciousness: awake, alert , oriented and patient cooperative  Airway & Oxygen Therapy: Patient Spontanous Breathing and Patient connected to nasal cannula oxygen  Post-op Assessment: Report given to PACU RN, Post -op Vital signs reviewed and stable and SAB level T12.  Post vital signs: Reviewed and stable  Complications: No apparent anesthesia complications

## 2012-04-27 NOTE — Interval H&P Note (Signed)
History and Physical Interval Note:  04/27/2012 12:38 PM  Whitney Gregory  has presented today for surgery, with the diagnosis of Left Knee Medial Compartmental Osteoarthritis  The various methods of treatment have been discussed with the patient and family. After consideration of risks, benefits and other options for treatment, the patient has consented to  Procedure(s) (LRB):LEFT UNICOMPARTMENTAL KNEE (Left) as a surgical intervention .  The patients' history has been reviewed, patient examined, no change in status, stable for surgery.  I have reviewed the patients' chart and labs.  Questions were answered to the patient's satisfaction.     Shelda Pal

## 2012-04-27 NOTE — Anesthesia Procedure Notes (Signed)
Spinal  Patient location during procedure: OR Staffing Anesthesiologist: Karson Chicas Performed by: anesthesiologist  Preanesthetic Checklist Completed: patient identified, site marked, surgical consent, pre-op evaluation, timeout performed, IV checked, risks and benefits discussed and monitors and equipment checked Spinal Block Patient position: sitting Prep: Betadine Patient monitoring: heart rate, continuous pulse ox and blood pressure Location: L2-3 Injection technique: single-shot Needle Needle type: Spinocan  Needle gauge: 22 G Needle length: 9 cm Additional Notes Expiration date of kit checked and confirmed. Patient tolerated procedure well, without complications.     

## 2012-04-27 NOTE — Anesthesia Postprocedure Evaluation (Signed)
  Anesthesia Post-op Note  Patient: Whitney Gregory  Procedure(s) Performed: Procedure(s) (LRB): UNICOMPARTMENTAL KNEE (Left)  Patient Location: PACU  Anesthesia Type: Spinal  Level of Consciousness: awake and alert   Airway and Oxygen Therapy: Patient Spontanous Breathing  Post-op Pain: mild  Post-op Assessment: Post-op Vital signs reviewed, Patient's Cardiovascular Status Stable, Respiratory Function Stable, Patent Airway and No signs of Nausea or vomiting  Post-op Vital Signs: stable  Complications: No apparent anesthesia complications

## 2012-04-28 LAB — BASIC METABOLIC PANEL
BUN: 8 mg/dL (ref 6–23)
CO2: 25 mEq/L (ref 19–32)
Calcium: 8.8 mg/dL (ref 8.4–10.5)
Glucose, Bld: 139 mg/dL — ABNORMAL HIGH (ref 70–99)
Sodium: 138 mEq/L (ref 135–145)

## 2012-04-28 LAB — CBC
HCT: 35.1 % — ABNORMAL LOW (ref 36.0–46.0)
Hemoglobin: 11.7 g/dL — ABNORMAL LOW (ref 12.0–15.0)
MCH: 31.4 pg (ref 26.0–34.0)
MCHC: 33.3 g/dL (ref 30.0–36.0)
MCV: 94.1 fL (ref 78.0–100.0)
RBC: 3.73 MIL/uL — ABNORMAL LOW (ref 3.87–5.11)

## 2012-04-28 MED ORDER — FERROUS SULFATE 325 (65 FE) MG PO TABS: 325.0000 mg | ORAL_TABLET | Freq: Three times a day (TID) | ORAL | Status: DC

## 2012-04-28 MED ORDER — ACETAMINOPHEN 325 MG PO TABS: 650.0000 mg | ORAL_TABLET | Freq: Four times a day (QID) | ORAL | Status: AC | PRN

## 2012-04-28 MED ORDER — DSS 100 MG PO CAPS: 100.0000 mg | ORAL_CAPSULE | Freq: Two times a day (BID) | ORAL | Status: AC

## 2012-04-28 MED ORDER — DIPHENHYDRAMINE HCL 25 MG PO CAPS: 25.0000 mg | ORAL_CAPSULE | Freq: Four times a day (QID) | ORAL | Status: DC | PRN

## 2012-04-28 MED ORDER — POLYETHYLENE GLYCOL 3350 17 G PO PACK: 17.0000 g | PACK | Freq: Two times a day (BID) | ORAL | Status: AC

## 2012-04-28 MED ORDER — CELECOXIB 200 MG PO CAPS: 200.0000 mg | ORAL_CAPSULE | Freq: Two times a day (BID) | ORAL | Status: DC

## 2012-04-28 MED ORDER — METHOCARBAMOL 500 MG PO TABS: 500.0000 mg | ORAL_TABLET | Freq: Four times a day (QID) | ORAL | Status: AC | PRN

## 2012-04-28 MED ORDER — OXYCODONE HCL 5 MG PO TABS: 5.0000 mg | ORAL_TABLET | ORAL | Status: AC | PRN

## 2012-04-28 MED ORDER — ASPIRIN EC 325 MG PO TBEC: 325.0000 mg | DELAYED_RELEASE_TABLET | Freq: Two times a day (BID) | ORAL | Status: AC

## 2012-04-28 NOTE — Care Management Note (Signed)
    Page 1 of 2   04/28/2012     3:22:36 PM   CARE MANAGEMENT NOTE 04/28/2012  Patient:  Whitney Gregory, Whitney Gregory   Account Number:  [id number]  Date Initiated:  04/28/2012  Documentation initiated by:  Colleen Can  Subjective/Objective Assessment:   dx left knee medial compartmentl osteoarthritis ; Left uniknee replacemnt on day of admission  Worker's comp claim [id number] IF  Adjuster-Sharon [phone]     Action/Plan:   CM spoke with patient and spouse. Plans are for patient to return to her home where spouse will be caregiver. She already has RW. SHe is taking agency that is authorized by worker's comp for hhpt/[employer] is requesting authorization   Anticipated DC Date:  04/28/2012   Anticipated DC Plan:  HOME W HOME HEALTH SERVICES  In-house referral  NA      DC Planning Services  CM consult      [facility] Choice  HOME HEALTH   Choice offered to / List presented to:  C-1 Patient   DME arranged  NA      DME agency  NA     HH arranged  HH-2 PT      [facility] agency  [facility]   Status of service:  Completed, signed off Medicare Important Message given?  NO (If response is "NO", the following Medicare IM given date fields will be blank) Date Medicare IM given:   Date Additional Medicare IM given:    Discharge Disposition:  HOME W HOME HEALTH SERVICES  Per UR Regulation:  Reviewed for med. necessity/level of care/duration of stay  If discussed at Long Length of Stay Meetings, dates discussed:    Comments:  04/28/2012 Raynelle Bring BSN CCM [phone] Per [employer]-rep/worker's  comp has authorized Whitney Gregory to provide [facility] services for patient upon discharge. Services will staqrt tomorow 04/30/2012

## 2012-04-28 NOTE — Evaluation (Signed)
Physical Therapy Evaluation Patient Details Name: Whitney Gregory MRN: [id number] DOB: 02-21-1956 Today's Date: 04/28/2012 Time: 0902-0927 PT Time Calculation (min): 25 min  PT Assessment / Plan / Recommendation Clinical Impression  Pt presents s/p L uni knee replacement POD 1 with decreased strength, ROM in LLE.  Tolerated ambulation in hallway with RW very well with no increase in pain.  Pt will benefit from skilled PT in acute venue to address deficits.  Pt recommends HHPT for follow up therapy at D/C to return pt to prior level of functioning.      PT Assessment  Patient needs continued PT services    Follow Up Recommendations  Home health PT    Barriers to Discharge        lEquipment Recommendations  3 in 1 bedside comode (may need 3in1. )    Recommendations for Other Services     Frequency 7X/week    Precautions / Restrictions Precautions Precautions: Knee Restrictions Weight Bearing Restrictions: No LLE Weight Bearing: Weight bearing as tolerated Other Position/Activity Restrictions: Did not use KI, pt able to perform SLR <10 lag x 10 reps   Pertinent Vitals/Pain 9/10      Mobility  Bed Mobility Bed Mobility: Supine to Sit Supine to Sit: 5: Supervision Details for Bed Mobility Assistance: Supervision for safety. Pt demos good UE technique to get to EOB Transfers Transfers: Sit to Stand;Stand to Sit Sit to Stand: 5: Supervision;With upper extremity assist;From bed Stand to Sit: 5: Supervision;With upper extremity assist;With armrests;To chair/3-in-1 Details for Transfer Assistance: Supervision for safety with min cues for hand placement when sitting/standing.  Ambulation/Gait Ambulation/Gait Assistance: 5: Supervision Ambulation Distance (Feet): 120 Feet Assistive device: Rolling walker Ambulation/Gait Assistance Details: Cues for sequencing/technique with RW and for upright posture.  Gait Pattern: Step-to pattern Gait velocity: decreased Stairs:  No Wheelchair Mobility Wheelchair Mobility: No    Exercises     PT Diagnosis: Difficulty walking;Acute pain  PT Problem List: Decreased strength;Decreased mobility;Pain PT Treatment Interventions: DME instruction;Gait training;Stair training;Functional mobility training;Therapeutic activities;Therapeutic exercise;Balance training;Patient/family education   PT Goals Acute Rehab PT Goals PT Goal Formulation: With patient Time For Goal Achievement: 04/28/12 Potential to Achieve Goals: Good Pt will Ambulate: 51 - 150 feet;with modified independence;with least restrictive assistive device PT Goal: Ambulate - Progress: Goal set today Pt will Go Up / Down Stairs: 3-5 stairs;with supervision;with least restrictive assistive device PT Goal: Up/Down Stairs - Progress: Goal set today Pt will Perform Home Exercise Program: Independently PT Goal: Perform Home Exercise Program - Progress: Goal set today  Visit Information  Last PT Received On: 04/28/12 Assistance Needed: +1    Subjective Data  Subjective: I'm ready to get back in my own bed.  Patient Stated Goal: to get home   Prior Functioning  Home Living Lives With: Spouse;Daughter Available Help at Discharge: Family Type of Home: House Home Access: Stairs to enter Secretary/administrator of Steps: 3 Entrance Stairs-Rails: Right;Left Home Layout: One level Bathroom Shower/Tub: Health visitor: Standard Bathroom Accessibility: Yes How Accessible: Accessible via walker Home Adaptive Equipment: Walker - rolling;Crutches Prior Function Level of Independence: Independent Able to Take Stairs?: Yes Driving: Yes Vocation: Full time employment Communication Communication: No difficulties    Cognition  Overall Cognitive Status: Appears within functional limits for tasks assessed/performed Arousal/Alertness: Awake/alert Orientation Level: Appears intact for tasks assessed Behavior During Session: [facility] for tasks  performed    Extremity/Trunk Assessment Right Lower Extremity Assessment RLE ROM/Strength/Tone: [facility] for tasks assessed RLE Sensation: [facility] -  Light Touch RLE Coordination: WFL - gross/fine motor Left Lower Extremity Assessment LLE ROM/Strength/Tone: Deficits LLE ROM/Strength/Tone Deficits: ankle motions WFL, able to perform SLR independently against gravity LLE Sensation: WFL - Light Touch LLE Coordination: WFL - gross/fine motor Trunk Assessment Trunk Assessment: Normal   Balance    End of Session PT - End of Session Activity Tolerance: Patient tolerated treatment well Patient left: in chair;with call bell/phone within reach Nurse Communication: Mobility status   Page, Meribeth Mattes 04/28/2012, 10:10 AM

## 2012-04-28 NOTE — Progress Notes (Signed)
Subjective: 1 Day Post-Op Procedure(s) (LRB): UNICOMPARTMENTAL KNEE (Left)   Patient reports pain as mild, pain in the knee is well controlled. States that she is having a headache which resolves mainly with laying flat. No other events throughout the night. Ready to be discharged home  Objective:   VITALS:   Filed Vitals:   04/28/12 1013  BP: 143/71  Pulse: 62  Temp: 98.3 F (36.8 C)  Resp: 16    Neurovascular intact Dorsiflexion/Plantar flexion intact Incision: dressing C/D/I No cellulitis present Compartment soft  LABS  Basename 04/28/12 0415  HGB 11.7*  HCT 35.1*  WBC 8.8  PLT 243     Basename 04/28/12 0415  NA 138  K 4.0  BUN 8  CREATININE 0.51  GLUCOSE 139*     Assessment/Plan: 1 Day Post-Op Procedure(s) (LRB): UNICOMPARTMENTAL KNEE (Left)   Foley cath d/c'ed HV drain d/c'ed Advance diet Up with therapy D/C IV fluids Discharge home with home health after PT x 2 Follow up in 2 weeks at [facility].  Follow-up Information    Follow up with OLIN,Gabreal Worton D in 2 weeks.   Contact information:   [facility] [address] [place] [address] [phone]          Anastasio Auerbach. Dagen Beevers   PAC  04/28/2012, 10:43 AM

## 2012-04-28 NOTE — Progress Notes (Signed)
Physical Therapy Treatment Patient Details Name: Whitney Gregory MRN: [id number] DOB: January 26, 1956 Today's Date: 04/28/2012 Time: 0102-7253 PT Time Calculation (min): 26 min  PT Assessment / Plan / Recommendation Comments on Treatment Session  Pt doing very well with ambulation, stair training and exercises.  Pt ready for D/C.     Follow Up Recommendations  Home health PT    Barriers to Discharge        Equipment Recommendations  None recommended by PT    Recommendations for Other Services    Frequency 7X/week   Plan Discharge plan remains appropriate    Precautions / Restrictions Precautions Precautions: Knee Restrictions Weight Bearing Restrictions: No LLE Weight Bearing: Weight bearing as tolerated Other Position/Activity Restrictions: Did not use KI, pt able to perform SLR <10 lag x 10 reps   Pertinent Vitals/Pain 9/10    Mobility  Bed Mobility Bed Mobility: Supine to Sit;Sit to Supine Supine to Sit: 6: Modified independent (Device/Increase time) Sit to Supine: 6: Modified independent (Device/Increase time) Details for Bed Mobility Assistance: increased time Transfers Transfers: Sit to Stand;Stand to Sit Sit to Stand: 6: Modified independent (Device/Increase time) Stand to Sit: 6: Modified independent (Device/Increase time) Details for Transfer Assistance: increased time.  Good use of UE Ambulation/Gait Ambulation/Gait Assistance: 6: Modified independent (Device/Increase time) Ambulation Distance (Feet): 120 Feet Assistive device: Rolling walker Gait Pattern: Step-to pattern Gait velocity: decreased Stairs: Yes Stairs Assistance: 4: Min guard Stair Management Technique: One rail Right;Step to pattern;Forwards Number of Stairs: 2  Wheelchair Mobility Wheelchair Mobility: No    Exercises Total Joint Exercises Ankle Circles/Pumps: AROM;Both;20 reps Quad Sets: AROM;Left;10 reps Short Arc Quad: AROM;Left;10 reps Heel Slides: AAROM;Left;10 reps Hip  ABduction/ADduction: AROM;Left;10 reps Straight Leg Raises: AROM;Left;10 reps   PT Diagnosis:    PT Problem List:   PT Treatment Interventions:     PT Goals Acute Rehab PT Goals PT Goal Formulation: With patient Time For Goal Achievement: 04/28/12 Potential to Achieve Goals: Good Pt will Ambulate: 51 - 150 feet;with modified independence;with least restrictive assistive device PT Goal: Ambulate - Progress: Met Pt will Go Up / Down Stairs: 3-5 stairs;with supervision;with least restrictive assistive device PT Goal: Up/Down Stairs - Progress: Partly met Pt will Perform Home Exercise Program: Independently PT Goal: Perform Home Exercise Program - Progress: Partly met  Visit Information  Last PT Received On: 04/28/12 Assistance Needed: +1    Subjective Data  Subjective: My leg still hurts really bad when I am up on it. Patient Stated Goal: to get home   Cognition  Overall Cognitive Status: Appears within functional limits for tasks assessed/performed Arousal/Alertness: Awake/alert Orientation Level: Appears intact for tasks assessed Behavior During Session: [facility] for tasks performed    Balance     End of Session PT - End of Session Activity Tolerance: Patient tolerated treatment well Patient left: in bed;with call bell/phone within reach;with family/visitor present Nurse Communication: Mobility status    Page, Meribeth Mattes 04/28/2012, 2:12 PM

## 2012-04-29 ENCOUNTER — Encounter (HOSPITAL_COMMUNITY): Payer: Self-pay | Admitting: Orthopedic Surgery

## 2012-04-29 NOTE — Progress Notes (Signed)
Progress note from 5/29 sent to insurance company via [employer] to serve as DC summary

## 2012-05-03 NOTE — Discharge Summary (Signed)
Physician Discharge Summary  Patient ID: Whitney Gregory MRN: [id number] DOB/AGE: Jun 20, 1956 56 y.o.  Admit date: 04/27/2012 Discharge date: 04/28/2012  Procedures:  Procedure(s) (LRB): UNICOMPARTMENTAL KNEE (Left)  Attending Physician:  Dr. Durene Romans   Admission Diagnoses:  Left knee medial compartment OA and pain     Discharge Diagnoses:  Principal Problem:  *S/P left UKR Fibromyalgia   Arthritis   Anemia - hx of   GERD (gastroesophageal reflux disease)   Anxiety   Depression   HPI: Pt is a 56 y.o. female complaining of medial aspect left knee pain since July 2012. Pain started with an injury while at work. She had a previous arthroscopy which did nothing to relieve the pain. Do to continued pain and failure of conservative treatments including steroid injections and Euflexxa injections she would like to proceed with more definitive treatment. X-rays in the clinic showed arthritic changes and an MRI revealed a osteochondral defect on the medial aspect of the left knee. Various options are discussed with the patient. Risks, benefits and expectations were discussed with the patient. Patient understand the risks, benefits and expectations and wishes to proceed with surgery.   PCP: Cala Bradford, MD, MD   Discharged Condition: good  Hospital Course:  Patient underwent the above stated procedure on 04/27/2012. Patient tolerated the procedure well and brought to the recovery room in good condition and subsequently to the floor.  POD #1 BP: 143/71 ; Pulse: 62 ; Temp: 98.3 F (36.8 C) ; Resp: 16  Pt's foley was removed, as well as the hemovac drain removed. IV was changed to a saline lock. Patient reports pain as mild, pain in the knee is well controlled. States that she is having a headache which resolves mainly with laying flat. No other events throughout the night. Ready to be discharged home. Neurovascular intact, dorsiflexion/plantar flexion intact, incision: dressing C/D/I,  no cellulitis present and compartment soft.   LABS  Basename  04/28/12 0415   HGB  11.7  HCT  35.1    Discharge Exam: General appearance: alert, cooperative and no distress Extremities: Homans sign is negative, no sign of DVT, no edema, redness or tenderness in the calves or thighs and no ulcers, gangrene or trophic changes  Disposition: Home  with follow up in 2 weeks  Follow-up Information    Follow up with OLIN,Sosaia Pittinger D in 2 weeks.   Contact information:   [facility] [address] [place] [address] [phone]          Discharge Orders    Future Orders Please Complete By Expires   Call MD / Call 911      Comments:   If you experience chest pain or shortness of breath, CALL 911 and be transported to the hospital emergency room.  If you develope a fever above 101 F, pus (white drainage) or increased drainage or redness at the wound, or calf pain, call your surgeon's office.   Discharge instructions      Comments:   Maintain surgical dressing for 8 days, then replace with gauze and tape. Keep the area dry and clean until follow up. Follow up in 2 weeks at [facility]. Call with any questions or concerns.     Constipation Prevention      Comments:   Drink plenty of fluids.  Prune juice may be helpful.  You may use a stool softener, such as Colace (over the counter) 100 mg twice a day.  Use MiraLax (over  the counter) for constipation as needed.   Increase activity slowly as tolerated      Driving restrictions      Comments:   No driving for 4 weeks   TED hose      Comments:   Use stockings (TED hose) for 2 weeks on both leg(s).  You may remove them at night for sleeping.   Change dressing      Comments:   Maintain surgical dressing for 8 days, then change the dressing daily with sterile 4 x 4 inch gauze dressing and tape. Keep the area dry and clean.      Discharge Medication List as of 04/28/2012  1:02 PM      START taking these medications   Details  acetaminophen (TYLENOL) 325 MG tablet Take 2 tablets (650 mg total) by mouth every 6 (six) hours as needed (or Fever >/= 101)., Starting 04/28/2012, Until Thu 04/28/13, No Print    aspirin EC 325 MG tablet Take 1 tablet (325 mg total) by mouth 2 (two) times daily. X 4 weeks, Starting 04/28/2012, Until Sat 05/08/12, No Print    diphenhydrAMINE (BENADRYL) 25 mg capsule Take 1 capsule (25 mg total) by mouth every 6 (six) hours as needed for itching, allergies or sleep., Starting 04/28/2012, Until Sat 05/08/12, No Print    docusate sodium 100 MG CAPS Take 100 mg by mouth 2 (two) times daily., Starting 04/28/2012, Until Sat 05/08/12, No Print    ferrous sulfate 325 (65 FE) MG tablet Take 1 tablet (325 mg total) by mouth 3 (three) times daily after meals., Starting 04/28/2012, Until Thu 04/28/13, No Print    methocarbamol (ROBAXIN) 500 MG tablet Take 1 tablet (500 mg total) by mouth every 6 (six) hours as needed (muscle spasms)., Starting 04/28/2012, Until Sat 05/08/12, No Print    oxyCODONE (OXY IR/ROXICODONE) 5 MG immediate release tablet Take 1-3 tablets (5-15 mg total) by mouth every 4 (four) hours as needed for pain., Starting 04/28/2012, Until Sat 05/08/12, Print    polyethylene glycol (MIRALAX / GLYCOLAX) packet Take 17 g by mouth 2 (two) times daily., Starting 04/28/2012, Until Sat 05/01/12, No Print      CONTINUE these medications which have CHANGED   Details  celecoxib (CELEBREX) 200 MG capsule Take 1 capsule (200 mg total) by mouth 2 (two) times daily., Starting 04/28/2012, Until Discontinued, No Print      CONTINUE these medications which have NOT CHANGED   Details  ALPRAZolam (XANAX) 0.25 MG tablet Take 0.25 mg by mouth at bedtime as needed. For  Sleep , Until Discontinued, Historical Med    Black Cohosh 540 MG CAPS Take 1 capsule by mouth at bedtime., Until Discontinued, Historical Med    buPROPion (WELLBUTRIN) 100 MG tablet Take 300 mg by mouth daily. ,  Until Discontinued, Historical Med    FLUoxetine (PROZAC) 20 MG capsule Take 20 mg by mouth daily with breakfast. , Until Discontinued, Historical Med    oxyCODONE (OXYCONTIN) 10 MG 12 hr tablet Take 20 mg by mouth every 12 (twelve) hours. , Until Discontinued, Historical Med    temazepam (RESTORIL) 22.5 MG capsule Take 22.5 mg by mouth at bedtime as needed. For sleep, Until Discontinued, Historical Med          Signed: Anastasio Auerbach. Meleena Munroe   PAC  05/03/2012, 8:28 AM

## 2013-02-10 ENCOUNTER — Other Ambulatory Visit: Payer: Self-pay | Admitting: Rheumatology

## 2013-02-10 DIAGNOSIS — Z780 Asymptomatic menopausal state: Secondary | ICD-10-CM

## 2013-09-07 ENCOUNTER — Ambulatory Visit (INDEPENDENT_AMBULATORY_CARE_PROVIDER_SITE_OTHER): Payer: BC Managed Care – PPO | Admitting: Physician Assistant

## 2013-09-07 VITALS — BP 144/80 | HR 76 | Temp 99.4°F | Resp 18 | Ht 62.75 in | Wt 136.8 lb

## 2013-09-07 DIAGNOSIS — J029 Acute pharyngitis, unspecified: Secondary | ICD-10-CM

## 2013-09-07 MED ORDER — AMOXICILLIN 875 MG PO TABS: 875.0000 mg | ORAL_TABLET | Freq: Two times a day (BID) | ORAL | Status: DC

## 2013-09-07 NOTE — Progress Notes (Signed)
I have examined this patient along with the student and agree.  Due to known exposure to Strep Pharyngitis, elect to cover for same without laboratory confirmation.

## 2013-09-07 NOTE — Patient Instructions (Signed)
Drink plenty of fluids and rest. Try hot tea with honey to soothe throat. You may take Tylenol or Advil for throat pain.

## 2013-09-07 NOTE — Progress Notes (Signed)
  Subjective:    Patient ID: Whitney Gregory, female    DOB: March 07, 1956, 57 y.o.   MRN: [id number]  HPI  Whitney Gregory is a 57 year old nonsmoker female who complains of a sore throat. She was with her daughter over the weekend who recently tested positive for strep. [name] developed a sore throat on Sunday and it has gotten progressively worse since then. She has tried zinc, vitamin C and Airborne. She does not think she has run a fever but feels achey all over and fatigued. Her voice is hoarse and scratchy. She has pain with swallowing but no dysphagia. No runny nose, nasal/sinus congestion, ear pain, or headaches. No cough, SOB, or chest pain. No nausea, vomiting, or GI upset.   Review of Systems As above    Objective:   Physical Exam  Constitutional: She is oriented to person, place, and time. She appears well-developed and well-nourished.  HENT:  Head: Normocephalic and atraumatic.  Right Ear: Tympanic membrane, external ear and ear canal normal.  Left Ear: Tympanic membrane, external ear and ear canal normal.  Nose: Rhinorrhea present. No mucosal edema or sinus tenderness. Right sinus exhibits no maxillary sinus tenderness and no frontal sinus tenderness. Left sinus exhibits no maxillary sinus tenderness.  Mouth/Throat: Uvula is midline and mucous membranes are normal. Posterior oropharyngeal erythema present. No tonsillar abscesses.  Eyes: Conjunctivae are normal. Right eye exhibits no discharge. Left eye exhibits no discharge.  Cardiovascular: Normal rate, regular rhythm, S1 normal, S2 normal and normal heart sounds.   Pulmonary/Chest: Effort normal and breath sounds normal.  Lymphadenopathy:    She has no cervical adenopathy.  Neurological: She is alert and oriented to person, place, and time.  Skin: Skin is warm and dry.  Psychiatric: She has a normal mood and affect.   BP 144/80  Pulse 76  Temp(Src) 99.4 F (37.4 C) (Oral)  Resp 18  Ht 5' 2.75" (1.594 m)  Wt 136 lb  12.8 oz (62.052 kg)  BMI 24.42 kg/m2  SpO2 98%     Assessment & Plan:  Pharyngitis - Plan: amoxicillin (AMOXIL) 875 MG tablet  Drink plenty of fluids and rest! Try hot tea with honey. You may take Tylenol for throat pain if needed.

## 2014-02-08 ENCOUNTER — Other Ambulatory Visit: Payer: Self-pay | Admitting: Family Medicine

## 2014-02-08 ENCOUNTER — Ambulatory Visit
Admission: RE | Admit: 2014-02-08 | Discharge: 2014-02-08 | Disposition: A | Discharge status: Home / Self Care | Payer: BC Managed Care – PPO | Source: Ambulatory Visit | Attending: Family Medicine | Admitting: Family Medicine

## 2014-02-08 DIAGNOSIS — M542 Cervicalgia: Secondary | ICD-10-CM

## 2014-02-08 HISTORY — DX: Cervicalgia: M54.2

## 2014-02-10 ENCOUNTER — Other Ambulatory Visit: Payer: Self-pay | Admitting: Family Medicine

## 2014-02-10 DIAGNOSIS — M542 Cervicalgia: Secondary | ICD-10-CM

## 2014-02-11 ENCOUNTER — Other Ambulatory Visit: Payer: Self-pay

## 2015-06-12 ENCOUNTER — Other Ambulatory Visit: Payer: Self-pay | Admitting: Physician Assistant

## 2015-06-12 ENCOUNTER — Encounter (HOSPITAL_COMMUNITY): Payer: Self-pay | Admitting: Emergency Medicine

## 2015-06-12 ENCOUNTER — Emergency Department (HOSPITAL_COMMUNITY)
Admission: EM | Admit: 2015-06-12 | Discharge: 2015-06-12 | Disposition: A | Discharge status: Home / Self Care | Payer: BLUE CROSS/BLUE SHIELD | Attending: Emergency Medicine | Admitting: Emergency Medicine

## 2015-06-12 ENCOUNTER — Emergency Department (HOSPITAL_COMMUNITY): Payer: BLUE CROSS/BLUE SHIELD

## 2015-06-12 ENCOUNTER — Encounter: Payer: Self-pay | Admitting: Physician Assistant

## 2015-06-12 DIAGNOSIS — R079 Chest pain, unspecified: Secondary | ICD-10-CM

## 2015-06-12 DIAGNOSIS — Z792 Long term (current) use of antibiotics: Secondary | ICD-10-CM | POA: Diagnosis not present

## 2015-06-12 DIAGNOSIS — R000 Tachycardia, unspecified: Secondary | ICD-10-CM | POA: Diagnosis not present

## 2015-06-12 DIAGNOSIS — I10 Essential (primary) hypertension: Secondary | ICD-10-CM | POA: Diagnosis not present

## 2015-06-12 DIAGNOSIS — M1990 Unspecified osteoarthritis, unspecified site: Secondary | ICD-10-CM | POA: Insufficient documentation

## 2015-06-12 DIAGNOSIS — F329 Major depressive disorder, single episode, unspecified: Secondary | ICD-10-CM | POA: Insufficient documentation

## 2015-06-12 DIAGNOSIS — F419 Anxiety disorder, unspecified: Secondary | ICD-10-CM | POA: Diagnosis not present

## 2015-06-12 DIAGNOSIS — Z862 Personal history of diseases of the blood and blood-forming organs and certain disorders involving the immune mechanism: Secondary | ICD-10-CM | POA: Diagnosis not present

## 2015-06-12 DIAGNOSIS — Z79899 Other long term (current) drug therapy: Secondary | ICD-10-CM | POA: Diagnosis not present

## 2015-06-12 DIAGNOSIS — M797 Fibromyalgia: Secondary | ICD-10-CM | POA: Diagnosis not present

## 2015-06-12 DIAGNOSIS — K219 Gastro-esophageal reflux disease without esophagitis: Secondary | ICD-10-CM | POA: Insufficient documentation

## 2015-06-12 LAB — BASIC METABOLIC PANEL
ANION GAP: 12 (ref 5–15)
BUN: 24 mg/dL — AB (ref 6–20)
CALCIUM: 9.2 mg/dL (ref 8.9–10.3)
CO2: 22 mmol/L (ref 22–32)
Chloride: 103 mmol/L (ref 101–111)
Creatinine, Ser: 0.71 mg/dL (ref 0.44–1.00)
GFR calc Af Amer: >60 mL/min (ref >60)
GFR calc non Af Amer: >60 mL/min (ref >60)
Glucose, Bld: 162 mg/dL — ABNORMAL HIGH (ref 65–99)
Potassium: 3.9 mmol/L (ref 3.5–5.1)
SODIUM: 137 mmol/L (ref 135–145)

## 2015-06-12 LAB — I-STAT TROPONIN, ED
TROPONIN I, POC: 0 ng/mL (ref 0.00–0.08)
TROPONIN I, POC: 0 ng/mL (ref 0.00–0.08)

## 2015-06-12 LAB — CBC
HCT: 40.3 % (ref 36.0–46.0)
HEMOGLOBIN: 13.9 g/dL (ref 12.0–15.0)
MCH: 31 pg (ref 26.0–34.0)
MCHC: 34.5 g/dL (ref 30.0–36.0)
MCV: 90 fL (ref 78.0–100.0)
Platelets: 247 10*3/uL (ref 150–400)
RBC: 4.48 MIL/uL (ref 3.87–5.11)
RDW: 12.6 % (ref 11.5–15.5)
WBC: 5.1 10*3/uL (ref 4.0–10.5)

## 2015-06-12 LAB — D-DIMER, QUANTITATIVE (NOT AT ARMC)

## 2015-06-12 MED ORDER — OMEPRAZOLE 20 MG PO CPDR: 20.0000 mg | DELAYED_RELEASE_CAPSULE | Freq: Every day | ORAL | Status: DC

## 2015-06-12 MED ORDER — ONDANSETRON HCL 4 MG/2ML IJ SOLN
4.0000 mg | Freq: Once | INTRAMUSCULAR | Status: AC
Start: 2015-06-12 — End: 2015-06-12
  Administered 2015-06-12: 4 mg via INTRAVENOUS
  Filled 2015-06-12: qty 2

## 2015-06-12 MED ORDER — GI COCKTAIL ~~LOC~~
30.0000 mL | Freq: Once | ORAL | Status: AC
  Administered 2015-06-12: 30 mL via ORAL
  Filled 2015-06-12: qty 30

## 2015-06-12 MED ORDER — HYDROMORPHONE HCL 1 MG/ML IJ SOLN
1.0000 mg | Freq: Once | INTRAMUSCULAR | Status: AC
  Administered 2015-06-12: 1 mg via INTRAVENOUS
  Filled 2015-06-12: qty 1

## 2015-06-12 NOTE — ED Notes (Signed)
Portable CXR at bedside.

## 2015-06-12 NOTE — ED Notes (Signed)
Awake. Verbally responsive. A/O x4. Resp even and unlabored. No audible adventitious breath sounds noted. ABC's intact. SR on monitor. IV x2 saline lock patent and intact.

## 2015-06-12 NOTE — ED Notes (Signed)
Awake. Verbally responsive. A/O x4. Resp even and unlabored. No audible adventitious breath sounds noted. ABC's intact.  

## 2015-06-12 NOTE — ED Provider Notes (Signed)
CSN: [id number]     Arrival date & time 06/12/15  1310 History   First MD Initiated Contact with Patient 06/12/15 1331     Chief Complaint  Patient presents with  . Chest Pain  . Tachycardia  . Hypertension     Patient is a 59 y.o. female presenting with chest pain and hypertension. No language interpreter was used.  Chest Pain Hypertension Associated symptoms include chest pain.   [name] presents for evaluation of chest pain. She reports intermittent left-sided chest pain since last night. She describes it as a tightness and indigestion type feeling. She took aspirin yesterday and the pain resolved. She developed recurrent pain today. She took 3 baby aspirin and 2 full dose aspirin today. Her pain is improved since it began. She has associated shortness of breath. She has nausea. No fever, cough, abdominal pain, vomiting, leg swelling or pain. She has history of borderline diabetes, no hypertension, no history of DVT/PE, no oral estrogen use. Symptoms are moderate, waxing and waning, worsening.  Past Medical History  Diagnosis Date  . Fibromyalgia   . Arthritis   . Anemia     hx of   . GERD (gastroesophageal reflux disease)     occasional   . Anxiety   . Depression   . Cervicalgia     cervical surgery 1999 & 2000   Past Surgical History  Procedure Laterality Date  . Abdominal hysterectomy    . Tonsillectomy    . Neck surgery    . Dilation and curettage of uterus    . Other surgical history      lapararoscopies for endometriosis   . Partial knee arthroplasty  04/27/2012    Procedure: UNICOMPARTMENTAL KNEE;  Surgeon: Shelda PalMatthew D Olin, MD;  Location: [facility] ORS;  Service: Orthopedics;  Laterality: Left;   Family History  Problem Relation Age of Onset  . Anesthesia problems Mother   . Hypertension Father    History  Substance Use Topics  . Smoking status: Never Smoker   . Smokeless tobacco: Never Used  . Alcohol Use: 1.2 oz/week    2 Glasses of wine per week   OB  History    No data available     Review of Systems  Cardiovascular: Positive for chest pain.  All other systems reviewed and are negative.     Allergies  Review of patient's allergies indicates no known allergies.  Home Medications   Prior to Admission medications   Medication Sig Start Date End Date Taking? Authorizing Provider  ALPRAZolam (XANAX) 0.25 MG tablet Take 0.25 mg by mouth at bedtime as needed. For  Sleep     Historical Provider, MD  amoxicillin (AMOXIL) 875 MG tablet Take 1 tablet (875 mg total) by mouth 2 (two) times daily. 09/07/13   Whitney Jeffery, PA-C  Black Cohosh 540 MG CAPS Take 1 capsule by mouth at bedtime.    Historical Provider, MD  buPROPion (WELLBUTRIN) 100 MG tablet Take 300 mg by mouth daily.     Historical Provider, MD  celecoxib (CELEBREX) 200 MG capsule Take 1 capsule (200 mg total) by mouth 2 (two) times daily. 04/28/12   Lanney GinsMatthew Babish, PA-C  diphenhydrAMINE (BENADRYL) 25 mg capsule Take 1 capsule (25 mg total) by mouth every 6 (six) hours as needed for itching, allergies or sleep. 04/28/12 05/08/12  Lanney GinsMatthew Babish, PA-C  ferrous sulfate 325 (65 FE) MG tablet Take 1 tablet (325 mg total) by mouth 3 (three) times daily after meals. 04/28/12 04/28/13  Lanney Gins, PA-C  FLUoxetine (PROZAC) 20 MG capsule Take 20 mg by mouth daily with breakfast.     Historical Provider, MD  oxyCODONE (OXYCONTIN) 10 MG 12 hr tablet Take 20 mg by mouth every 12 (twelve) hours.     Historical Provider, MD  temazepam (RESTORIL) 22.5 MG capsule Take 22.5 mg by mouth at bedtime as needed. For sleep    Historical Provider, MD   BP 201/155 mmHg  Pulse 125  Temp(Src) 97.6 F (36.4 C) (Oral)  Resp 18  SpO2 100% Physical Exam  Constitutional: She is oriented to person, place, and time. She appears well-developed and well-nourished.  HENT:  Head: Normocephalic and atraumatic.  Cardiovascular: Regular rhythm.   No murmur heard. Tachycardic  Pulmonary/Chest: Effort normal and  breath sounds normal. No respiratory distress.  Abdominal: Soft. There is no tenderness. There is no rebound and no guarding.  Musculoskeletal: She exhibits no edema or tenderness.  Neurological: She is alert and oriented to person, place, and time.  Skin: Skin is warm and dry.  Psychiatric: She has a normal mood and affect. Her behavior is normal.  Nursing note and vitals reviewed.   ED Course  Procedures (including critical care time) Labs Review Labs Reviewed  BASIC METABOLIC PANEL - Abnormal; Notable for the following:    Glucose, Bld 162 (*)    BUN 24 (*)    All other components within normal limits  CBC  D-DIMER, QUANTITATIVE (NOT AT [facility])  Whitney Gregory, ED  Whitney Gregory, ED    Imaging Review Dg Chest Port 1 View  06/12/2015   CLINICAL DATA:  Shortness of breath and chest tightness for 1 day  EXAM: PORTABLE CHEST - 1 VIEW  COMPARISON:  None.  FINDINGS: Lungs are clear. Heart size and pulmonary vascularity are normal. No adenopathy. No bone lesions.  IMPRESSION: No edema or consolidation.   Electronically Signed   By: Bretta Bang III M.D.   On: 06/12/2015 14:07     EKG Interpretation None      MDM   Final diagnoses:  Chest pain, unspecified chest pain type    Pt here for evaluation of chest pain, atypical on presentation and better after GI cocktail, EKG without ischemic changes.  Plan to repeat troponin and follow up with cardiology as an outpatient.  Clinical picture not c/w PE, dissection, CHF, pna.      Whitney Fossa, MD 06/13/15 [phone]

## 2015-06-12 NOTE — ED Notes (Signed)
Pt states that she thinks she is having a heart attack.  Pt states that about 15 minutes ago, started having crushing CP, heart racing, and BP of 180 systolic.  Pt states that she took a xanax and it's not helping.

## 2015-06-12 NOTE — Discharge Instructions (Signed)

## 2015-06-12 NOTE — Progress Notes (Signed)
ER called Trish pager requesting outpatient exercise nuclear stress test. Alvester ChouLindsey Samuel is filling in for Trish and is helping to arrange this f/u. Order placed in Epic. Dayna Dunn PA-C

## 2015-06-12 NOTE — ED Notes (Signed)
Awake. Verbally responsive. A/O x4. Resp even and unlabored. No audible adventitious breath sounds noted. ABC's intact. SR on monitor. Pt reported lt side chest pain non-radiating, lightheaded and dizziness, SHOB, (-)nausea, and (+)nausea while at rest.

## 2015-06-12 NOTE — ED Notes (Signed)
Awake. Verbally responsive. A/O x4. Resp even and unlabored. No audible adventitious breath sounds noted. ABC's intact. SR on monitor. IV x2 SL and family at bedside.

## 2015-06-14 ENCOUNTER — Telehealth (HOSPITAL_COMMUNITY): Payer: Self-pay

## 2015-06-14 NOTE — Telephone Encounter (Signed)
Encounter complete. 

## 2015-06-19 ENCOUNTER — Ambulatory Visit (HOSPITAL_COMMUNITY)
Admission: RE | Admit: 2015-06-19 | Discharge: 2015-06-19 | Disposition: A | Discharge status: Home / Self Care | Payer: BLUE CROSS/BLUE SHIELD | Source: Ambulatory Visit | Attending: Cardiovascular Disease | Admitting: Cardiovascular Disease

## 2015-06-19 DIAGNOSIS — R079 Chest pain, unspecified: Secondary | ICD-10-CM | POA: Diagnosis not present

## 2015-06-19 LAB — MYOCARDIAL PERFUSION IMAGING
CHL CUP NUCLEAR SDS: 1
CHL CUP NUCLEAR SSS: 1
CSEPHR: 101 %
Estimated workload: 10.1 METS
Exercise duration (min): 9 min
LVDIAVOL: 76 mL
LVSYSVOL: 26 mL
MPHR: 161 {beats}/min
Peak HR: 164 {beats}/min
RPE: 15
Rest HR: 76 {beats}/min
SRS: 0
TID: 0.88

## 2015-06-19 MED ORDER — TECHNETIUM TC 99M SESTAMIBI GENERIC - CARDIOLITE
10.9000 | Freq: Once | INTRAVENOUS | Status: AC | PRN
  Administered 2015-06-19: 10.9 via INTRAVENOUS

## 2015-06-19 MED ORDER — TECHNETIUM TC 99M SESTAMIBI GENERIC - CARDIOLITE
30.1000 | Freq: Once | INTRAVENOUS | Status: AC | PRN
  Administered 2015-06-19: 30.1 via INTRAVENOUS

## 2015-10-22 ENCOUNTER — Other Ambulatory Visit: Payer: Self-pay | Admitting: Pain Medicine

## 2015-10-22 DIAGNOSIS — M545 Low back pain: Secondary | ICD-10-CM

## 2015-10-23 ENCOUNTER — Ambulatory Visit
Admission: RE | Admit: 2015-10-23 | Discharge: 2015-10-23 | Disposition: A | Discharge status: Home / Self Care | Payer: BLUE CROSS/BLUE SHIELD | Source: Ambulatory Visit | Attending: Pain Medicine | Admitting: Pain Medicine

## 2015-10-23 DIAGNOSIS — M545 Low back pain: Secondary | ICD-10-CM

## 2016-03-06 DIAGNOSIS — J301 Allergic rhinitis due to pollen: Secondary | ICD-10-CM | POA: Diagnosis not present

## 2016-03-06 DIAGNOSIS — J069 Acute upper respiratory infection, unspecified: Secondary | ICD-10-CM | POA: Diagnosis not present

## 2016-03-10 DIAGNOSIS — G894 Chronic pain syndrome: Secondary | ICD-10-CM | POA: Diagnosis not present

## 2016-03-10 DIAGNOSIS — M792 Neuralgia and neuritis, unspecified: Secondary | ICD-10-CM | POA: Diagnosis not present

## 2016-03-10 DIAGNOSIS — Z79899 Other long term (current) drug therapy: Secondary | ICD-10-CM | POA: Diagnosis not present

## 2016-03-10 DIAGNOSIS — M2550 Pain in unspecified joint: Secondary | ICD-10-CM | POA: Diagnosis not present

## 2016-03-10 DIAGNOSIS — Z79891 Long term (current) use of opiate analgesic: Secondary | ICD-10-CM | POA: Diagnosis not present

## 2016-03-10 DIAGNOSIS — M545 Low back pain: Secondary | ICD-10-CM | POA: Diagnosis not present

## 2016-03-21 ENCOUNTER — Ambulatory Visit
Admission: RE | Admit: 2016-03-21 | Discharge: 2016-03-21 | Disposition: A | Discharge status: Home / Self Care | Payer: BLUE CROSS/BLUE SHIELD | Source: Ambulatory Visit | Attending: Physician Assistant | Admitting: Physician Assistant

## 2016-03-21 ENCOUNTER — Other Ambulatory Visit: Payer: Self-pay | Admitting: Physician Assistant

## 2016-03-21 DIAGNOSIS — M79672 Pain in left foot: Secondary | ICD-10-CM

## 2016-03-31 ENCOUNTER — Encounter: Payer: Self-pay | Admitting: Podiatry

## 2016-03-31 ENCOUNTER — Ambulatory Visit (INDEPENDENT_AMBULATORY_CARE_PROVIDER_SITE_OTHER): Payer: BLUE CROSS/BLUE SHIELD | Admitting: Podiatry

## 2016-03-31 VITALS — BP 175/83 | HR 74 | Resp 18

## 2016-03-31 DIAGNOSIS — M47817 Spondylosis without myelopathy or radiculopathy, lumbosacral region: Secondary | ICD-10-CM | POA: Diagnosis not present

## 2016-03-31 DIAGNOSIS — M722 Plantar fascial fibromatosis: Secondary | ICD-10-CM | POA: Diagnosis not present

## 2016-03-31 DIAGNOSIS — M216X9 Other acquired deformities of unspecified foot: Secondary | ICD-10-CM

## 2016-03-31 NOTE — Progress Notes (Signed)
   Subjective:    Patient ID: Whitney Gregory, female    DOB: [birth year], 60 y.o.   MRN: [id number]  HPI  60 year old female presents the office of consent left heel pain gets been ongoing for approximate one month. She states that she has pain in the morning when she first gets up and throughout the day. She appears a had plantar fasciitis more in the arch of her foot and she was wearing an over-the-counter insert which did seem to help however it is no longer helping this condition. She denies any recent injury or trauma. No swelling or redness. No tingling or numbness. She has been stretching and icing without much relief. The pain does not wake her up at night. No other complaints.    Review of Systems  All other systems reviewed and are negative.      Objective:   Physical Exam General: AAO x3, NAD  Dermatological: Skin is warm, dry and supple bilateral. Nails x 10 are well manicured; remaining integument appears unremarkable at this time. There are no open sores, no preulcerative lesions, no rash or signs of infection present.  Vascular: Dorsalis Pedis artery and Posterior Tibial artery pedal pulses are 2/4 bilateral with immedate capillary fill time. Pedal hair growth present. No varicosities and no lower extremity edema present bilateral. There is no pain with calf compression, swelling, warmth, erythema.   Neruologic: Grossly intact via light touch bilateral. Vibratory intact via tuning fork bilateral. Protective threshold with Semmes Wienstein monofilament intact to all pedal sites bilateral. Patellar and Achilles deep tendon reflexes 2+ bilateral. No Babinski or clonus noted bilateral.   Musculoskeletal: Tenderness to palpation along the plantar medial tubercle of the calcaneus at the insertion of plantar fascia on the left foot. There is no pain along the course of the plantar fascia within the arch of the foot. Plantar fascia appears to be intact. There is no pain with lateral  compression of the calcaneus or pain with vibratory sensation. There is no pain along the course or insertion of the achilles tendon. No other areas of tenderness to bilateral lower extremities. Cavus foot type bilaterally. No pain to the right foot. Mild equinus.   Gait: Unassisted, Nonantalgic.       Assessment & Plan:  60 year old female left heel pain, likely plantar fasciitis -Treatment options discussed including all alternatives, risks, and complications -Etiology of symptoms were discussed -Previous x-rays were reviewed -Patient elects to proceed with steroid injection into the left heel. Under sterile skin preparation, a total of 2.5cc of kenalog 10, 0.5% Marcaine plain, and 2% lidocaine plain were infiltrated into the symptomatic area without complication. A band-aid was applied. Patient tolerated the injection well without complication. Post-injection care with discussed with the patient. Discussed with the patient to ice the area over the next couple of days to help prevent a steroid flare.  -Plantar fascial brace -She is taking Celebrex recommended continue this. She wishes to hold off on oral steroids. -Ice to the area. -Given her foot type and reoccurrence of plantar fasciitis a recommend custom inserts. The over-the-counter inserts that she is wearing does not fit her appropriately. At this point she wishes to proceed with custom inserts and she was [facility] -Follow-up in 3 weeks or sooner if any problems arise. In the meantime, encouraged to call the office with any questions, concerns, change in symptoms.   Ovid CurdMatthew Wagoner, DPM

## 2016-03-31 NOTE — Patient Instructions (Signed)

## 2016-04-14 DIAGNOSIS — Z79899 Other long term (current) drug therapy: Secondary | ICD-10-CM | POA: Diagnosis not present

## 2016-04-14 DIAGNOSIS — M542 Cervicalgia: Secondary | ICD-10-CM | POA: Diagnosis not present

## 2016-04-14 DIAGNOSIS — Z79891 Long term (current) use of opiate analgesic: Secondary | ICD-10-CM | POA: Diagnosis not present

## 2016-04-14 DIAGNOSIS — M545 Low back pain: Secondary | ICD-10-CM | POA: Diagnosis not present

## 2016-04-14 DIAGNOSIS — G894 Chronic pain syndrome: Secondary | ICD-10-CM | POA: Diagnosis not present

## 2016-04-14 DIAGNOSIS — M2550 Pain in unspecified joint: Secondary | ICD-10-CM | POA: Diagnosis not present

## 2016-04-15 DIAGNOSIS — M545 Low back pain: Secondary | ICD-10-CM | POA: Diagnosis not present

## 2016-04-25 ENCOUNTER — Ambulatory Visit: Payer: BLUE CROSS/BLUE SHIELD | Admitting: Podiatry

## 2016-05-07 DIAGNOSIS — I10 Essential (primary) hypertension: Secondary | ICD-10-CM | POA: Diagnosis not present

## 2016-05-07 DIAGNOSIS — M797 Fibromyalgia: Secondary | ICD-10-CM | POA: Diagnosis not present

## 2016-05-07 DIAGNOSIS — F324 Major depressive disorder, single episode, in partial remission: Secondary | ICD-10-CM | POA: Diagnosis not present

## 2016-05-07 DIAGNOSIS — R7309 Other abnormal glucose: Secondary | ICD-10-CM | POA: Diagnosis not present

## 2016-05-09 ENCOUNTER — Encounter: Payer: Self-pay | Admitting: Podiatry

## 2016-05-09 ENCOUNTER — Ambulatory Visit (INDEPENDENT_AMBULATORY_CARE_PROVIDER_SITE_OTHER): Payer: BLUE CROSS/BLUE SHIELD | Admitting: Podiatry

## 2016-05-09 DIAGNOSIS — M722 Plantar fascial fibromatosis: Secondary | ICD-10-CM

## 2016-05-12 DIAGNOSIS — M792 Neuralgia and neuritis, unspecified: Secondary | ICD-10-CM | POA: Diagnosis not present

## 2016-05-12 DIAGNOSIS — Z79891 Long term (current) use of opiate analgesic: Secondary | ICD-10-CM | POA: Diagnosis not present

## 2016-05-12 DIAGNOSIS — Z79899 Other long term (current) drug therapy: Secondary | ICD-10-CM | POA: Diagnosis not present

## 2016-05-12 DIAGNOSIS — G894 Chronic pain syndrome: Secondary | ICD-10-CM | POA: Diagnosis not present

## 2016-05-12 DIAGNOSIS — M2550 Pain in unspecified joint: Secondary | ICD-10-CM | POA: Diagnosis not present

## 2016-05-12 DIAGNOSIS — F4542 Pain disorder with related psychological factors: Secondary | ICD-10-CM | POA: Diagnosis not present

## 2016-05-15 NOTE — Progress Notes (Signed)
Patient ID: Whitney Gregory, female   DOB: 1956-05-09, 60 y.o.   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 He states that he has improved some compared to last appointment although he does continue to have pain. He has continue stretching, icing as much as possible. He is also continue with anti-inflammatories. He also presents a pickup orthotics. No other complaints at this time. Denies any systemic complaints such as fevers, chills, nausea, vomiting. No acute changes since last appointment, and no other complaints at this time.   Objective: AAO x3, NAD DP/PT pulses palpable bilaterally, CRT less than 3 seconds There is continued, but improved, tenderness to palpation along the plantar medial tubercle of the calcaneus at the insertion of plantar fascia on the leftfoot. There is no pain along the course of the plantar fascia within the arch of the foot. Plantar fascia appears to be intact. There is no pain with lateral compression of the calcaneus or pain with vibratory sensation. There is no pain along the course or insertion of the achilles tendon. No other areas of tenderness to bilateral lower extremities.  No areas of pinpoint bony tenderness or pain with vibratory sensation. MMT 5/5, ROM WNL. No edema, erythema, increase in warmth to bilateral lower extremities.  No open lesions or pre-ulcerative lesions.  No pain with calf compression, swelling, warmth, erythema  Assessment: Left heel pain, plantar fasciitis  Plan: -All treatment options discussed with the patient including all alternatives, risks, complications.  -Patient elects to proceed with steroid injection into the left heel. Under sterile skin preparation, a total of 2.5cc of kenalog 10, 0.5% Marcaine plain, and 2% lidocaine plain were infiltrated into the symptomatic area without complication. A band-aid was applied. Patient  tolerated the injection well without complication. Post-injection care with discussed with the patient. Discussed with the patient to ice the area over the next couple of days to help prevent a steroid flare.  -Continue stretching, icing exercises daily. -Orthotics were dispensed today. Break in instructions were discussed -Anti-inflammatories as needed -Follow-up 4 weeks. -Patient encouraged to call the office with any questions, concerns, change in symptoms.   Ovid CurdMatthew Yarima Penman, DPM

## 2016-06-10 DIAGNOSIS — M792 Neuralgia and neuritis, unspecified: Secondary | ICD-10-CM | POA: Diagnosis not present

## 2016-06-10 DIAGNOSIS — M4806 Spinal stenosis, lumbar region: Secondary | ICD-10-CM | POA: Diagnosis not present

## 2016-06-10 DIAGNOSIS — M542 Cervicalgia: Secondary | ICD-10-CM | POA: Diagnosis not present

## 2016-06-10 DIAGNOSIS — G894 Chronic pain syndrome: Secondary | ICD-10-CM | POA: Diagnosis not present

## 2016-06-10 DIAGNOSIS — Z79899 Other long term (current) drug therapy: Secondary | ICD-10-CM | POA: Diagnosis not present

## 2016-06-10 DIAGNOSIS — Z79891 Long term (current) use of opiate analgesic: Secondary | ICD-10-CM | POA: Diagnosis not present

## 2016-06-12 DIAGNOSIS — H5213 Myopia, bilateral: Secondary | ICD-10-CM | POA: Diagnosis not present

## 2016-07-02 DIAGNOSIS — M545 Low back pain: Secondary | ICD-10-CM | POA: Diagnosis not present

## 2016-07-08 DIAGNOSIS — M792 Neuralgia and neuritis, unspecified: Secondary | ICD-10-CM | POA: Diagnosis not present

## 2016-07-08 DIAGNOSIS — Z79899 Other long term (current) drug therapy: Secondary | ICD-10-CM | POA: Diagnosis not present

## 2016-07-08 DIAGNOSIS — M4806 Spinal stenosis, lumbar region: Secondary | ICD-10-CM | POA: Diagnosis not present

## 2016-07-08 DIAGNOSIS — Z79891 Long term (current) use of opiate analgesic: Secondary | ICD-10-CM | POA: Diagnosis not present

## 2016-07-08 DIAGNOSIS — G894 Chronic pain syndrome: Secondary | ICD-10-CM | POA: Diagnosis not present

## 2016-07-08 DIAGNOSIS — M542 Cervicalgia: Secondary | ICD-10-CM | POA: Diagnosis not present

## 2016-07-11 IMAGING — CR DG FOOT COMPLETE 3+V*L*
3 series · 3 of 3 positions shown · non-contrast
Comparison: None.

CLINICAL DATA: Recurrent plantar fasciitis.  Left heel pain.

EXAM:
LEFT FOOT - COMPLETE 3+ VIEW

[x foot ap left]
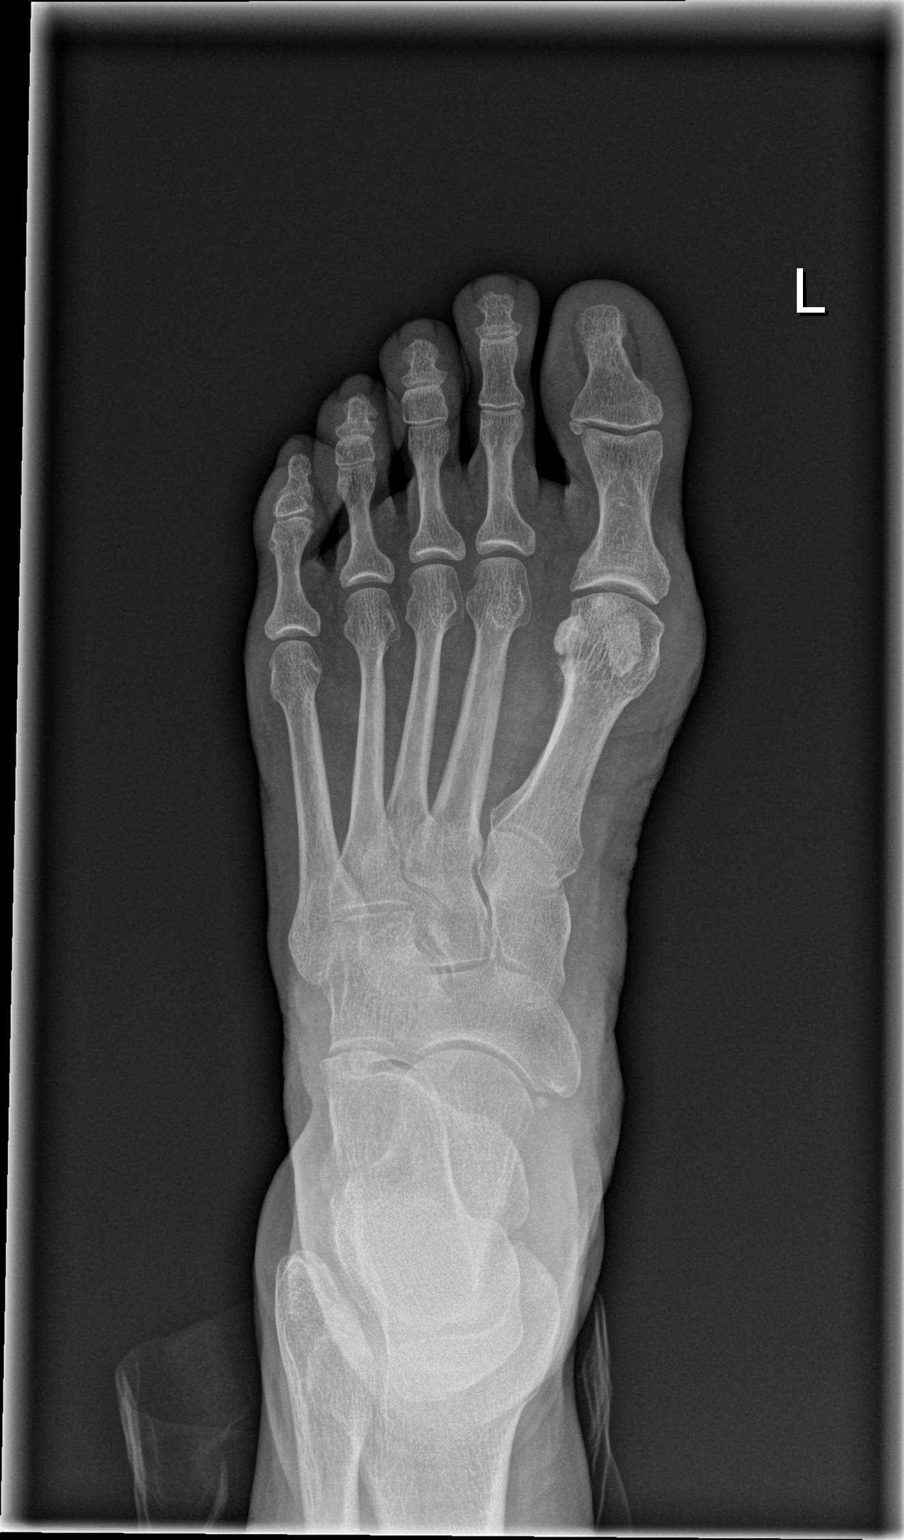

[x foot obl left]
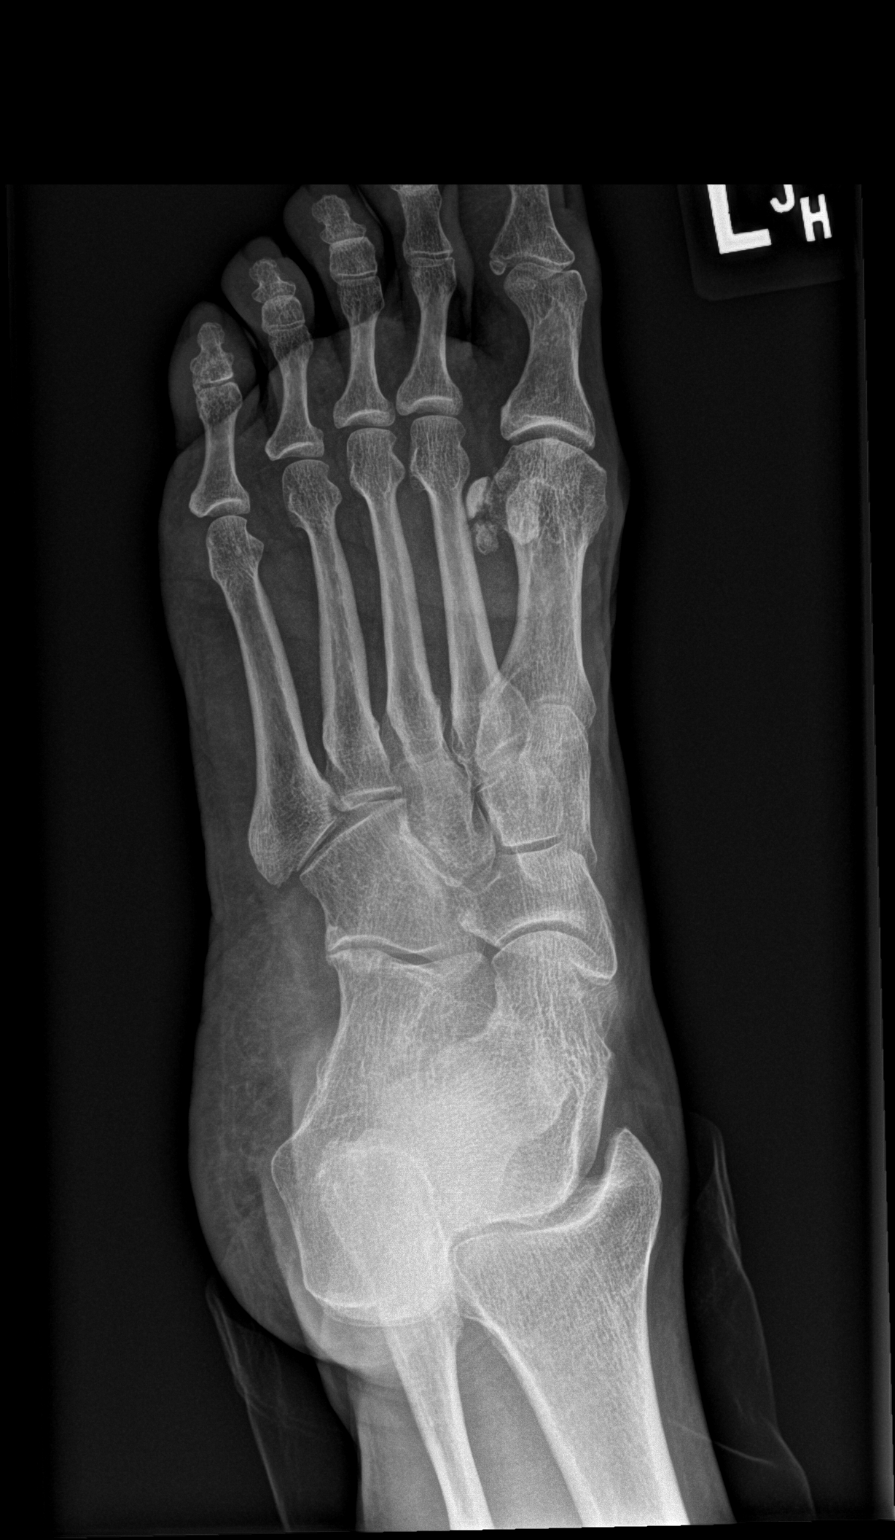

[x foot lat left]
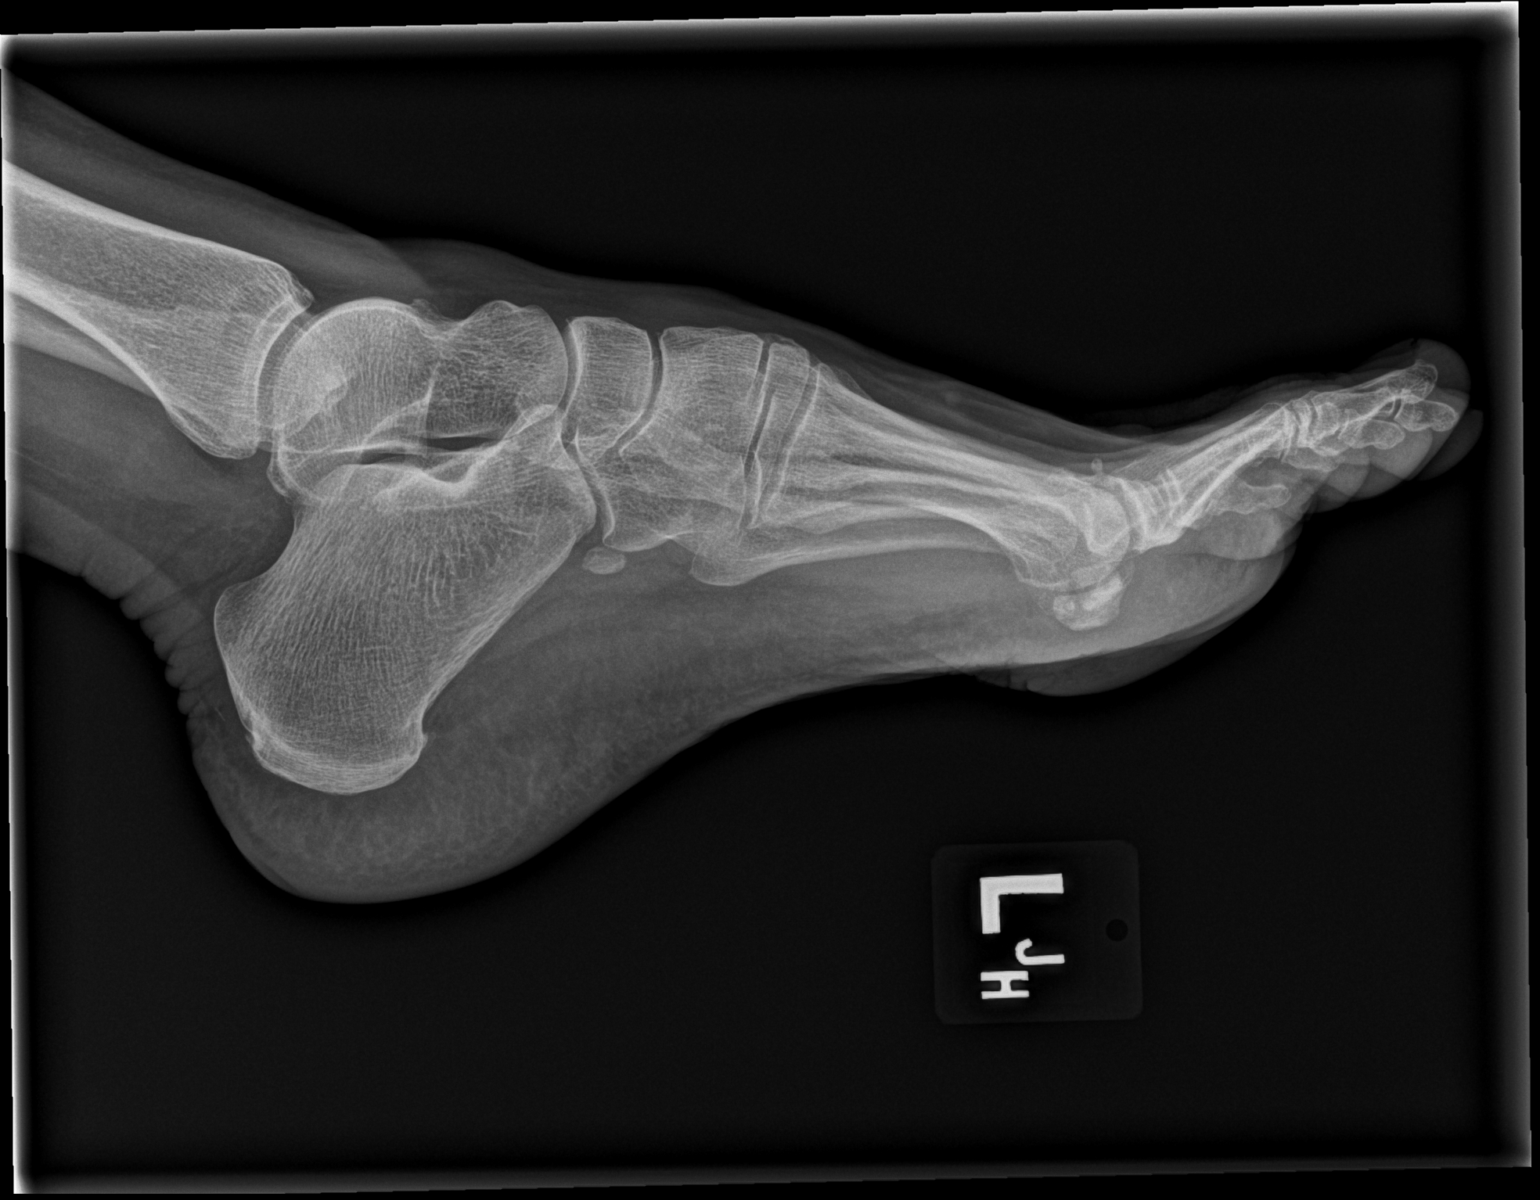

[3 of 3 positions shown; findings below may reference images not displayed]

FINDINGS: No acute bony abnormality. Specifically, no fracture, subluxation,
or dislocation. Soft tissues are intact. Early spurring at the first
MTP joint. No significant calcaneal spur.
IMPRESSION: No acute bony abnormality.

## 2016-08-06 DIAGNOSIS — G894 Chronic pain syndrome: Secondary | ICD-10-CM | POA: Diagnosis not present

## 2016-08-06 DIAGNOSIS — Z79899 Other long term (current) drug therapy: Secondary | ICD-10-CM | POA: Diagnosis not present

## 2016-08-06 DIAGNOSIS — Z79891 Long term (current) use of opiate analgesic: Secondary | ICD-10-CM | POA: Diagnosis not present

## 2016-08-06 DIAGNOSIS — M47817 Spondylosis without myelopathy or radiculopathy, lumbosacral region: Secondary | ICD-10-CM | POA: Diagnosis not present

## 2016-08-06 DIAGNOSIS — M5137 Other intervertebral disc degeneration, lumbosacral region: Secondary | ICD-10-CM | POA: Diagnosis not present

## 2016-08-06 DIAGNOSIS — M4696 Unspecified inflammatory spondylopathy, lumbar region: Secondary | ICD-10-CM | POA: Diagnosis not present

## 2016-08-11 DIAGNOSIS — D2239 Melanocytic nevi of other parts of face: Secondary | ICD-10-CM | POA: Diagnosis not present

## 2016-08-11 DIAGNOSIS — L738 Other specified follicular disorders: Secondary | ICD-10-CM | POA: Diagnosis not present

## 2016-08-11 DIAGNOSIS — D1801 Hemangioma of skin and subcutaneous tissue: Secondary | ICD-10-CM | POA: Diagnosis not present

## 2016-08-11 DIAGNOSIS — L821 Other seborrheic keratosis: Secondary | ICD-10-CM | POA: Diagnosis not present

## 2016-08-13 DIAGNOSIS — M5137 Other intervertebral disc degeneration, lumbosacral region: Secondary | ICD-10-CM | POA: Diagnosis not present

## 2016-08-13 DIAGNOSIS — M47817 Spondylosis without myelopathy or radiculopathy, lumbosacral region: Secondary | ICD-10-CM | POA: Diagnosis not present

## 2016-08-13 DIAGNOSIS — M1288 Other specific arthropathies, not elsewhere classified, other specified site: Secondary | ICD-10-CM | POA: Diagnosis not present

## 2016-08-13 DIAGNOSIS — M4696 Unspecified inflammatory spondylopathy, lumbar region: Secondary | ICD-10-CM | POA: Diagnosis not present

## 2016-09-02 DIAGNOSIS — Z23 Encounter for immunization: Secondary | ICD-10-CM | POA: Diagnosis not present

## 2016-09-02 DIAGNOSIS — M797 Fibromyalgia: Secondary | ICD-10-CM | POA: Diagnosis not present

## 2016-09-02 DIAGNOSIS — F324 Major depressive disorder, single episode, in partial remission: Secondary | ICD-10-CM | POA: Diagnosis not present

## 2016-09-02 DIAGNOSIS — I10 Essential (primary) hypertension: Secondary | ICD-10-CM | POA: Diagnosis not present

## 2016-09-03 DIAGNOSIS — M4696 Unspecified inflammatory spondylopathy, lumbar region: Secondary | ICD-10-CM | POA: Diagnosis not present

## 2016-09-03 DIAGNOSIS — Z79891 Long term (current) use of opiate analgesic: Secondary | ICD-10-CM | POA: Diagnosis not present

## 2016-09-03 DIAGNOSIS — Z79899 Other long term (current) drug therapy: Secondary | ICD-10-CM | POA: Diagnosis not present

## 2016-09-03 DIAGNOSIS — M47817 Spondylosis without myelopathy or radiculopathy, lumbosacral region: Secondary | ICD-10-CM | POA: Diagnosis not present

## 2016-09-03 DIAGNOSIS — M5137 Other intervertebral disc degeneration, lumbosacral region: Secondary | ICD-10-CM | POA: Diagnosis not present

## 2016-09-03 DIAGNOSIS — G894 Chronic pain syndrome: Secondary | ICD-10-CM | POA: Diagnosis not present

## 2016-09-10 DIAGNOSIS — M47817 Spondylosis without myelopathy or radiculopathy, lumbosacral region: Secondary | ICD-10-CM | POA: Diagnosis not present

## 2016-09-10 DIAGNOSIS — M4696 Unspecified inflammatory spondylopathy, lumbar region: Secondary | ICD-10-CM | POA: Diagnosis not present

## 2016-09-10 DIAGNOSIS — M1288 Other specific arthropathies, not elsewhere classified, other specified site: Secondary | ICD-10-CM | POA: Diagnosis not present

## 2016-09-10 DIAGNOSIS — M5137 Other intervertebral disc degeneration, lumbosacral region: Secondary | ICD-10-CM | POA: Diagnosis not present

## 2016-10-01 DIAGNOSIS — Z79891 Long term (current) use of opiate analgesic: Secondary | ICD-10-CM | POA: Diagnosis not present

## 2016-10-01 DIAGNOSIS — Z79899 Other long term (current) drug therapy: Secondary | ICD-10-CM | POA: Diagnosis not present

## 2016-10-01 DIAGNOSIS — G894 Chronic pain syndrome: Secondary | ICD-10-CM | POA: Diagnosis not present

## 2016-10-01 DIAGNOSIS — M4696 Unspecified inflammatory spondylopathy, lumbar region: Secondary | ICD-10-CM | POA: Diagnosis not present

## 2016-10-01 DIAGNOSIS — M5137 Other intervertebral disc degeneration, lumbosacral region: Secondary | ICD-10-CM | POA: Diagnosis not present

## 2016-10-01 DIAGNOSIS — M47817 Spondylosis without myelopathy or radiculopathy, lumbosacral region: Secondary | ICD-10-CM | POA: Diagnosis not present

## 2016-10-08 DIAGNOSIS — M5137 Other intervertebral disc degeneration, lumbosacral region: Secondary | ICD-10-CM | POA: Diagnosis not present

## 2016-10-08 DIAGNOSIS — M4696 Unspecified inflammatory spondylopathy, lumbar region: Secondary | ICD-10-CM | POA: Diagnosis not present

## 2016-10-08 DIAGNOSIS — M47817 Spondylosis without myelopathy or radiculopathy, lumbosacral region: Secondary | ICD-10-CM | POA: Diagnosis not present

## 2016-10-08 DIAGNOSIS — M545 Low back pain: Secondary | ICD-10-CM | POA: Diagnosis not present

## 2016-10-29 DIAGNOSIS — M48062 Spinal stenosis, lumbar region with neurogenic claudication: Secondary | ICD-10-CM | POA: Diagnosis not present

## 2016-10-29 DIAGNOSIS — Z79891 Long term (current) use of opiate analgesic: Secondary | ICD-10-CM | POA: Diagnosis not present

## 2016-10-29 DIAGNOSIS — M47816 Spondylosis without myelopathy or radiculopathy, lumbar region: Secondary | ICD-10-CM | POA: Diagnosis not present

## 2016-10-29 DIAGNOSIS — Z79899 Other long term (current) drug therapy: Secondary | ICD-10-CM | POA: Diagnosis not present

## 2016-10-29 DIAGNOSIS — M792 Neuralgia and neuritis, unspecified: Secondary | ICD-10-CM | POA: Diagnosis not present

## 2016-10-29 DIAGNOSIS — G894 Chronic pain syndrome: Secondary | ICD-10-CM | POA: Diagnosis not present

## 2016-11-05 DIAGNOSIS — M5137 Other intervertebral disc degeneration, lumbosacral region: Secondary | ICD-10-CM | POA: Diagnosis not present

## 2016-11-05 DIAGNOSIS — M4696 Unspecified inflammatory spondylopathy, lumbar region: Secondary | ICD-10-CM | POA: Diagnosis not present

## 2016-11-05 DIAGNOSIS — M545 Low back pain: Secondary | ICD-10-CM | POA: Diagnosis not present

## 2016-11-27 DIAGNOSIS — Z79899 Other long term (current) drug therapy: Secondary | ICD-10-CM | POA: Diagnosis not present

## 2016-11-27 DIAGNOSIS — M542 Cervicalgia: Secondary | ICD-10-CM | POA: Diagnosis not present

## 2016-11-27 DIAGNOSIS — Z79891 Long term (current) use of opiate analgesic: Secondary | ICD-10-CM | POA: Diagnosis not present

## 2016-11-27 DIAGNOSIS — M48062 Spinal stenosis, lumbar region with neurogenic claudication: Secondary | ICD-10-CM | POA: Diagnosis not present

## 2016-11-27 DIAGNOSIS — G894 Chronic pain syndrome: Secondary | ICD-10-CM | POA: Diagnosis not present

## 2016-11-27 DIAGNOSIS — M792 Neuralgia and neuritis, unspecified: Secondary | ICD-10-CM | POA: Diagnosis not present

## 2017-01-15 DIAGNOSIS — F324 Major depressive disorder, single episode, in partial remission: Secondary | ICD-10-CM | POA: Diagnosis not present

## 2017-01-15 DIAGNOSIS — G609 Hereditary and idiopathic neuropathy, unspecified: Secondary | ICD-10-CM | POA: Diagnosis not present

## 2017-01-15 DIAGNOSIS — I10 Essential (primary) hypertension: Secondary | ICD-10-CM | POA: Diagnosis not present

## 2017-01-15 DIAGNOSIS — G894 Chronic pain syndrome: Secondary | ICD-10-CM | POA: Diagnosis not present

## 2017-01-15 DIAGNOSIS — M792 Neuralgia and neuritis, unspecified: Secondary | ICD-10-CM | POA: Diagnosis not present

## 2017-01-15 DIAGNOSIS — F4542 Pain disorder with related psychological factors: Secondary | ICD-10-CM | POA: Diagnosis not present

## 2017-01-15 DIAGNOSIS — R05 Cough: Secondary | ICD-10-CM | POA: Diagnosis not present

## 2017-01-21 DIAGNOSIS — Z79899 Other long term (current) drug therapy: Secondary | ICD-10-CM | POA: Diagnosis not present

## 2017-01-21 DIAGNOSIS — M545 Low back pain: Secondary | ICD-10-CM | POA: Diagnosis not present

## 2017-01-21 DIAGNOSIS — M4696 Unspecified inflammatory spondylopathy, lumbar region: Secondary | ICD-10-CM | POA: Diagnosis not present

## 2017-01-21 DIAGNOSIS — G894 Chronic pain syndrome: Secondary | ICD-10-CM | POA: Diagnosis not present

## 2017-01-21 DIAGNOSIS — M5137 Other intervertebral disc degeneration, lumbosacral region: Secondary | ICD-10-CM | POA: Diagnosis not present

## 2017-01-21 DIAGNOSIS — M48062 Spinal stenosis, lumbar region with neurogenic claudication: Secondary | ICD-10-CM | POA: Diagnosis not present

## 2017-01-21 DIAGNOSIS — M47816 Spondylosis without myelopathy or radiculopathy, lumbar region: Secondary | ICD-10-CM | POA: Diagnosis not present

## 2017-01-21 DIAGNOSIS — Z79891 Long term (current) use of opiate analgesic: Secondary | ICD-10-CM | POA: Diagnosis not present

## 2017-01-21 DIAGNOSIS — M792 Neuralgia and neuritis, unspecified: Secondary | ICD-10-CM | POA: Diagnosis not present

## 2017-01-21 DIAGNOSIS — M47817 Spondylosis without myelopathy or radiculopathy, lumbosacral region: Secondary | ICD-10-CM | POA: Diagnosis not present

## 2017-02-19 DIAGNOSIS — G894 Chronic pain syndrome: Secondary | ICD-10-CM | POA: Diagnosis not present

## 2017-02-19 DIAGNOSIS — M48062 Spinal stenosis, lumbar region with neurogenic claudication: Secondary | ICD-10-CM | POA: Diagnosis not present

## 2017-02-19 DIAGNOSIS — M4696 Unspecified inflammatory spondylopathy, lumbar region: Secondary | ICD-10-CM | POA: Diagnosis not present

## 2017-02-19 DIAGNOSIS — M47817 Spondylosis without myelopathy or radiculopathy, lumbosacral region: Secondary | ICD-10-CM | POA: Diagnosis not present

## 2017-02-19 DIAGNOSIS — M47816 Spondylosis without myelopathy or radiculopathy, lumbar region: Secondary | ICD-10-CM | POA: Diagnosis not present

## 2017-02-19 DIAGNOSIS — Z79899 Other long term (current) drug therapy: Secondary | ICD-10-CM | POA: Diagnosis not present

## 2017-02-19 DIAGNOSIS — Z79891 Long term (current) use of opiate analgesic: Secondary | ICD-10-CM | POA: Diagnosis not present

## 2017-02-19 DIAGNOSIS — M5137 Other intervertebral disc degeneration, lumbosacral region: Secondary | ICD-10-CM | POA: Diagnosis not present

## 2017-02-19 DIAGNOSIS — M792 Neuralgia and neuritis, unspecified: Secondary | ICD-10-CM | POA: Diagnosis not present

## 2017-02-19 DIAGNOSIS — M545 Low back pain: Secondary | ICD-10-CM | POA: Diagnosis not present

## 2017-02-25 DIAGNOSIS — Z8669 Personal history of other diseases of the nervous system and sense organs: Secondary | ICD-10-CM | POA: Insufficient documentation

## 2017-02-25 DIAGNOSIS — M47812 Spondylosis without myelopathy or radiculopathy, cervical region: Secondary | ICD-10-CM | POA: Insufficient documentation

## 2017-02-25 DIAGNOSIS — Z96652 Presence of left artificial knee joint: Secondary | ICD-10-CM | POA: Insufficient documentation

## 2017-02-25 DIAGNOSIS — M47816 Spondylosis without myelopathy or radiculopathy, lumbar region: Secondary | ICD-10-CM | POA: Insufficient documentation

## 2017-02-25 DIAGNOSIS — M797 Fibromyalgia: Secondary | ICD-10-CM | POA: Insufficient documentation

## 2017-02-25 DIAGNOSIS — M19042 Primary osteoarthritis, left hand: Secondary | ICD-10-CM

## 2017-02-25 DIAGNOSIS — Z87898 Personal history of other specified conditions: Secondary | ICD-10-CM | POA: Insufficient documentation

## 2017-02-25 DIAGNOSIS — M19041 Primary osteoarthritis, right hand: Secondary | ICD-10-CM | POA: Insufficient documentation

## 2017-02-25 NOTE — Progress Notes (Deleted)
Office Visit Note  Patient: Whitney Gregory             Date of Birth: [birth year]           MRN: [id number]             PCP: Cala BradfordWHITE,CYNTHIA S, MD Referring: Laurann MontanaWhite, Cynthia, MD Visit Date: 03/05/2017 Occupation: @GUAROCC @    Subjective:  No chief complaint on file.   History of Present Illness: Whitney Gregory is a 61 y.o. female ***   Activities of Daily Living:  Patient reports morning stiffness for *** {minute/hour:19697}.   Patient {ACTIONS;DENIES/REPORTS:21021675::"Denies"} nocturnal pain.  Difficulty dressing/grooming: {ACTIONS;DENIES/REPORTS:21021675::"Denies"} Difficulty climbing stairs: {ACTIONS;DENIES/REPORTS:21021675::"Denies"} Difficulty getting out of chair: {ACTIONS;DENIES/REPORTS:21021675::"Denies"} Difficulty using hands for taps, buttons, cutlery, and/or writing: {ACTIONS;DENIES/REPORTS:21021675::"Denies"}   No Rheumatology ROS completed.   PMFS History:  Patient Active Problem List   Diagnosis Date Noted  . Fibromyalgia 02/25/2017  . DJD (degenerative joint disease), cervical 02/25/2017  . History of chronic pain/ in pain management  02/25/2017  . Primary osteoarthritis of both hands 02/25/2017  . History of total knee arthroplasty, left 02/25/2017  . Spondylosis of lumbar region without myelopathy or radiculopathy 02/25/2017  . History of migraine 02/25/2017  . S/P left UKR 04/27/2012    Past Medical History:  Diagnosis Date  . Anemia    hx of   . Anxiety   . Arthritis   . Cervicalgia    cervical surgery 1999 & 2000  . Depression   . Fibromyalgia   . GERD (gastroesophageal reflux disease)    occasional     Family History  Problem Relation Age of Onset  . Anesthesia problems Mother   . Hypertension Father    Past Surgical History:  Procedure Laterality Date  . ABDOMINAL HYSTERECTOMY    . DILATION AND CURETTAGE OF UTERUS    . NECK SURGERY    . OTHER SURGICAL HISTORY     lapararoscopies for endometriosis   . PARTIAL KNEE ARTHROPLASTY   04/27/2012   Procedure: UNICOMPARTMENTAL KNEE;  Surgeon: Shelda PalMatthew D Olin, MD;  Location: [facility];  Service: Orthopedics;  Laterality: Left;  . TONSILLECTOMY     Social History   Social History Narrative  . No narrative on file     Objective: Vital Signs: There were no vitals taken for this visit.   Physical Exam   Musculoskeletal Exam: ***  CDAI Exam: No CDAI exam completed.    Investigation: No additional findings.   Imaging: No results found.  Speciality Comments: No specialty comments available.    Procedures:  No procedures performed Allergies: Patient has no known allergies.   Assessment / Plan:     Visit Diagnoses: Fibromyalgia  DJD (degenerative joint disease), cervical  Spondylosis of lumbar region without myelopathy or radiculopathy  Primary osteoarthritis of both hands  Left total knee replacement  History of chronic pain/ in pain management   History of migraine  History of depression    Orders: No orders of the defined types were placed in this encounter.  No orders of the defined types were placed in this encounter.   Face-to-face time spent with patient was *** minutes. 50% of time was spent in counseling and coordination of care.  Follow-Up Instructions: No Follow-up on file.   Pollyann SavoyShaili Demichael Traum, MD  Note - This record has been created using Animal nutritionistDragon software.  Chart creation errors have been sought, but may not always  have been located. Such creation errors do not reflect on  the standard  of medical care.

## 2017-03-05 ENCOUNTER — Ambulatory Visit: Payer: BLUE CROSS/BLUE SHIELD | Admitting: Rheumatology

## 2017-03-30 DIAGNOSIS — M47817 Spondylosis without myelopathy or radiculopathy, lumbosacral region: Secondary | ICD-10-CM | POA: Diagnosis not present

## 2017-03-30 DIAGNOSIS — M5136 Other intervertebral disc degeneration, lumbar region: Secondary | ICD-10-CM | POA: Diagnosis not present

## 2017-04-28 DIAGNOSIS — M47816 Spondylosis without myelopathy or radiculopathy, lumbar region: Secondary | ICD-10-CM | POA: Diagnosis not present

## 2017-04-28 DIAGNOSIS — Z79899 Other long term (current) drug therapy: Secondary | ICD-10-CM | POA: Diagnosis not present

## 2017-04-28 DIAGNOSIS — M792 Neuralgia and neuritis, unspecified: Secondary | ICD-10-CM | POA: Diagnosis not present

## 2017-04-28 DIAGNOSIS — M48062 Spinal stenosis, lumbar region with neurogenic claudication: Secondary | ICD-10-CM | POA: Diagnosis not present

## 2017-04-28 DIAGNOSIS — G894 Chronic pain syndrome: Secondary | ICD-10-CM | POA: Diagnosis not present

## 2017-04-28 DIAGNOSIS — Z79891 Long term (current) use of opiate analgesic: Secondary | ICD-10-CM | POA: Diagnosis not present

## 2017-04-29 ENCOUNTER — Other Ambulatory Visit: Payer: Self-pay | Admitting: Pain Medicine

## 2017-04-29 DIAGNOSIS — M545 Low back pain, unspecified: Secondary | ICD-10-CM

## 2017-05-27 ENCOUNTER — Ambulatory Visit
Admission: RE | Admit: 2017-05-27 | Discharge: 2017-05-27 | Disposition: A | Discharge status: Home / Self Care | Payer: BLUE CROSS/BLUE SHIELD | Source: Ambulatory Visit | Attending: Pain Medicine | Admitting: Pain Medicine

## 2017-05-27 DIAGNOSIS — M792 Neuralgia and neuritis, unspecified: Secondary | ICD-10-CM | POA: Diagnosis not present

## 2017-05-27 DIAGNOSIS — Z79891 Long term (current) use of opiate analgesic: Secondary | ICD-10-CM | POA: Diagnosis not present

## 2017-05-27 DIAGNOSIS — M545 Low back pain, unspecified: Secondary | ICD-10-CM

## 2017-05-27 DIAGNOSIS — Z79899 Other long term (current) drug therapy: Secondary | ICD-10-CM | POA: Diagnosis not present

## 2017-05-27 DIAGNOSIS — M47816 Spondylosis without myelopathy or radiculopathy, lumbar region: Secondary | ICD-10-CM | POA: Diagnosis not present

## 2017-05-27 DIAGNOSIS — M5136 Other intervertebral disc degeneration, lumbar region: Secondary | ICD-10-CM | POA: Diagnosis not present

## 2017-05-27 DIAGNOSIS — M48061 Spinal stenosis, lumbar region without neurogenic claudication: Secondary | ICD-10-CM | POA: Diagnosis not present

## 2017-05-27 DIAGNOSIS — M48062 Spinal stenosis, lumbar region with neurogenic claudication: Secondary | ICD-10-CM | POA: Diagnosis not present

## 2017-05-27 DIAGNOSIS — M47817 Spondylosis without myelopathy or radiculopathy, lumbosacral region: Secondary | ICD-10-CM | POA: Diagnosis not present

## 2017-05-27 DIAGNOSIS — G894 Chronic pain syndrome: Secondary | ICD-10-CM | POA: Diagnosis not present

## 2017-06-10 DIAGNOSIS — G4700 Insomnia, unspecified: Secondary | ICD-10-CM | POA: Diagnosis not present

## 2017-06-10 DIAGNOSIS — F324 Major depressive disorder, single episode, in partial remission: Secondary | ICD-10-CM | POA: Diagnosis not present

## 2017-06-10 DIAGNOSIS — I10 Essential (primary) hypertension: Secondary | ICD-10-CM | POA: Diagnosis not present

## 2017-06-10 DIAGNOSIS — R7309 Other abnormal glucose: Secondary | ICD-10-CM | POA: Diagnosis not present

## 2017-06-24 DIAGNOSIS — Z79891 Long term (current) use of opiate analgesic: Secondary | ICD-10-CM | POA: Diagnosis not present

## 2017-06-24 DIAGNOSIS — M47816 Spondylosis without myelopathy or radiculopathy, lumbar region: Secondary | ICD-10-CM | POA: Diagnosis not present

## 2017-06-24 DIAGNOSIS — M48062 Spinal stenosis, lumbar region with neurogenic claudication: Secondary | ICD-10-CM | POA: Diagnosis not present

## 2017-06-24 DIAGNOSIS — G894 Chronic pain syndrome: Secondary | ICD-10-CM | POA: Diagnosis not present

## 2017-06-24 DIAGNOSIS — M792 Neuralgia and neuritis, unspecified: Secondary | ICD-10-CM | POA: Diagnosis not present

## 2017-06-24 DIAGNOSIS — Z79899 Other long term (current) drug therapy: Secondary | ICD-10-CM | POA: Diagnosis not present

## 2017-07-22 DIAGNOSIS — Z79899 Other long term (current) drug therapy: Secondary | ICD-10-CM | POA: Diagnosis not present

## 2017-07-22 DIAGNOSIS — M792 Neuralgia and neuritis, unspecified: Secondary | ICD-10-CM | POA: Diagnosis not present

## 2017-07-22 DIAGNOSIS — M48062 Spinal stenosis, lumbar region with neurogenic claudication: Secondary | ICD-10-CM | POA: Diagnosis not present

## 2017-07-22 DIAGNOSIS — Z79891 Long term (current) use of opiate analgesic: Secondary | ICD-10-CM | POA: Diagnosis not present

## 2017-07-22 DIAGNOSIS — M47816 Spondylosis without myelopathy or radiculopathy, lumbar region: Secondary | ICD-10-CM | POA: Diagnosis not present

## 2017-07-22 DIAGNOSIS — G894 Chronic pain syndrome: Secondary | ICD-10-CM | POA: Diagnosis not present

## 2017-08-19 DIAGNOSIS — M48062 Spinal stenosis, lumbar region with neurogenic claudication: Secondary | ICD-10-CM | POA: Diagnosis not present

## 2017-08-19 DIAGNOSIS — Z79891 Long term (current) use of opiate analgesic: Secondary | ICD-10-CM | POA: Diagnosis not present

## 2017-08-19 DIAGNOSIS — Z79899 Other long term (current) drug therapy: Secondary | ICD-10-CM | POA: Diagnosis not present

## 2017-08-19 DIAGNOSIS — G894 Chronic pain syndrome: Secondary | ICD-10-CM | POA: Diagnosis not present

## 2017-08-19 DIAGNOSIS — M792 Neuralgia and neuritis, unspecified: Secondary | ICD-10-CM | POA: Diagnosis not present

## 2017-08-19 DIAGNOSIS — M47816 Spondylosis without myelopathy or radiculopathy, lumbar region: Secondary | ICD-10-CM | POA: Diagnosis not present

## 2017-08-20 DIAGNOSIS — M25561 Pain in right knee: Secondary | ICD-10-CM | POA: Diagnosis not present

## 2017-08-21 DIAGNOSIS — M25561 Pain in right knee: Secondary | ICD-10-CM | POA: Diagnosis not present

## 2017-08-28 DIAGNOSIS — S83281A Other tear of lateral meniscus, current injury, right knee, initial encounter: Secondary | ICD-10-CM | POA: Diagnosis not present

## 2017-08-28 DIAGNOSIS — S83241A Other tear of medial meniscus, current injury, right knee, initial encounter: Secondary | ICD-10-CM | POA: Diagnosis not present

## 2017-08-28 DIAGNOSIS — M25561 Pain in right knee: Secondary | ICD-10-CM | POA: Diagnosis not present

## 2017-08-28 DIAGNOSIS — M25551 Pain in right hip: Secondary | ICD-10-CM | POA: Diagnosis not present

## 2017-09-16 DIAGNOSIS — Z79891 Long term (current) use of opiate analgesic: Secondary | ICD-10-CM | POA: Diagnosis not present

## 2017-09-16 DIAGNOSIS — M542 Cervicalgia: Secondary | ICD-10-CM | POA: Diagnosis not present

## 2017-09-16 DIAGNOSIS — M792 Neuralgia and neuritis, unspecified: Secondary | ICD-10-CM | POA: Diagnosis not present

## 2017-09-16 DIAGNOSIS — M48062 Spinal stenosis, lumbar region with neurogenic claudication: Secondary | ICD-10-CM | POA: Diagnosis not present

## 2017-09-16 DIAGNOSIS — Z79899 Other long term (current) drug therapy: Secondary | ICD-10-CM | POA: Diagnosis not present

## 2017-09-16 DIAGNOSIS — G894 Chronic pain syndrome: Secondary | ICD-10-CM | POA: Diagnosis not present

## 2017-09-18 DIAGNOSIS — M94261 Chondromalacia, right knee: Secondary | ICD-10-CM | POA: Diagnosis not present

## 2017-09-18 DIAGNOSIS — M23231 Derangement of other medial meniscus due to old tear or injury, right knee: Secondary | ICD-10-CM | POA: Diagnosis not present

## 2017-09-18 DIAGNOSIS — G8918 Other acute postprocedural pain: Secondary | ICD-10-CM | POA: Diagnosis not present

## 2017-09-18 DIAGNOSIS — S83241A Other tear of medial meniscus, current injury, right knee, initial encounter: Secondary | ICD-10-CM | POA: Diagnosis not present

## 2017-09-18 DIAGNOSIS — M23261 Derangement of other lateral meniscus due to old tear or injury, right knee: Secondary | ICD-10-CM | POA: Diagnosis not present

## 2017-09-18 DIAGNOSIS — S83281A Other tear of lateral meniscus, current injury, right knee, initial encounter: Secondary | ICD-10-CM | POA: Diagnosis not present

## 2017-09-22 DIAGNOSIS — Z23 Encounter for immunization: Secondary | ICD-10-CM | POA: Diagnosis not present

## 2017-09-22 DIAGNOSIS — G894 Chronic pain syndrome: Secondary | ICD-10-CM | POA: Diagnosis not present

## 2017-09-22 DIAGNOSIS — M5136 Other intervertebral disc degeneration, lumbar region: Secondary | ICD-10-CM | POA: Diagnosis not present

## 2017-09-28 DIAGNOSIS — M545 Low back pain: Secondary | ICD-10-CM | POA: Diagnosis not present

## 2017-11-27 DIAGNOSIS — G8929 Other chronic pain: Secondary | ICD-10-CM | POA: Diagnosis not present

## 2017-11-27 DIAGNOSIS — M25561 Pain in right knee: Secondary | ICD-10-CM | POA: Diagnosis not present

## 2017-12-11 DIAGNOSIS — R300 Dysuria: Secondary | ICD-10-CM | POA: Diagnosis not present

## 2017-12-11 DIAGNOSIS — G43909 Migraine, unspecified, not intractable, without status migrainosus: Secondary | ICD-10-CM | POA: Diagnosis not present

## 2017-12-11 DIAGNOSIS — M5416 Radiculopathy, lumbar region: Secondary | ICD-10-CM | POA: Diagnosis not present

## 2017-12-11 DIAGNOSIS — R5383 Other fatigue: Secondary | ICD-10-CM | POA: Diagnosis not present

## 2017-12-11 DIAGNOSIS — R7303 Prediabetes: Secondary | ICD-10-CM | POA: Diagnosis not present

## 2017-12-11 DIAGNOSIS — F5101 Primary insomnia: Secondary | ICD-10-CM | POA: Diagnosis not present

## 2018-03-11 DIAGNOSIS — G4700 Insomnia, unspecified: Secondary | ICD-10-CM | POA: Diagnosis not present

## 2018-03-11 DIAGNOSIS — F419 Anxiety disorder, unspecified: Secondary | ICD-10-CM | POA: Diagnosis not present

## 2018-03-11 DIAGNOSIS — F324 Major depressive disorder, single episode, in partial remission: Secondary | ICD-10-CM | POA: Diagnosis not present

## 2018-03-11 DIAGNOSIS — M5136 Other intervertebral disc degeneration, lumbar region: Secondary | ICD-10-CM | POA: Diagnosis not present

## 2018-04-16 DIAGNOSIS — M5416 Radiculopathy, lumbar region: Secondary | ICD-10-CM | POA: Diagnosis not present

## 2018-04-16 DIAGNOSIS — M545 Low back pain: Secondary | ICD-10-CM | POA: Diagnosis not present

## 2018-04-23 ENCOUNTER — Other Ambulatory Visit: Payer: Self-pay | Admitting: Orthopedic Surgery

## 2018-04-23 DIAGNOSIS — M5416 Radiculopathy, lumbar region: Secondary | ICD-10-CM

## 2018-04-24 ENCOUNTER — Ambulatory Visit
Admission: RE | Admit: 2018-04-24 | Discharge: 2018-04-24 | Disposition: A | Discharge status: Home / Self Care | Payer: BLUE CROSS/BLUE SHIELD | Source: Ambulatory Visit | Attending: Orthopedic Surgery | Admitting: Orthopedic Surgery

## 2018-04-24 DIAGNOSIS — M5416 Radiculopathy, lumbar region: Secondary | ICD-10-CM

## 2018-04-24 DIAGNOSIS — M48061 Spinal stenosis, lumbar region without neurogenic claudication: Secondary | ICD-10-CM | POA: Diagnosis not present

## 2018-05-10 DIAGNOSIS — M4696 Unspecified inflammatory spondylopathy, lumbar region: Secondary | ICD-10-CM | POA: Diagnosis not present

## 2018-06-07 DIAGNOSIS — M47816 Spondylosis without myelopathy or radiculopathy, lumbar region: Secondary | ICD-10-CM | POA: Diagnosis not present

## 2018-06-10 DIAGNOSIS — R7309 Other abnormal glucose: Secondary | ICD-10-CM | POA: Diagnosis not present

## 2018-06-10 DIAGNOSIS — F419 Anxiety disorder, unspecified: Secondary | ICD-10-CM | POA: Diagnosis not present

## 2018-06-10 DIAGNOSIS — G4700 Insomnia, unspecified: Secondary | ICD-10-CM | POA: Diagnosis not present

## 2018-06-10 DIAGNOSIS — I10 Essential (primary) hypertension: Secondary | ICD-10-CM | POA: Diagnosis not present

## 2018-06-10 DIAGNOSIS — F324 Major depressive disorder, single episode, in partial remission: Secondary | ICD-10-CM | POA: Diagnosis not present

## 2018-07-14 DIAGNOSIS — M47816 Spondylosis without myelopathy or radiculopathy, lumbar region: Secondary | ICD-10-CM | POA: Diagnosis not present

## 2018-07-26 DIAGNOSIS — M47816 Spondylosis without myelopathy or radiculopathy, lumbar region: Secondary | ICD-10-CM | POA: Diagnosis not present

## 2018-08-18 DIAGNOSIS — M47816 Spondylosis without myelopathy or radiculopathy, lumbar region: Secondary | ICD-10-CM | POA: Diagnosis not present

## 2018-09-16 DIAGNOSIS — M47816 Spondylosis without myelopathy or radiculopathy, lumbar region: Secondary | ICD-10-CM | POA: Diagnosis not present

## 2018-09-17 DIAGNOSIS — F419 Anxiety disorder, unspecified: Secondary | ICD-10-CM | POA: Diagnosis not present

## 2018-09-17 DIAGNOSIS — R7303 Prediabetes: Secondary | ICD-10-CM | POA: Diagnosis not present

## 2018-09-17 DIAGNOSIS — F324 Major depressive disorder, single episode, in partial remission: Secondary | ICD-10-CM | POA: Diagnosis not present

## 2018-09-17 DIAGNOSIS — I10 Essential (primary) hypertension: Secondary | ICD-10-CM | POA: Diagnosis not present

## 2018-09-17 DIAGNOSIS — Z23 Encounter for immunization: Secondary | ICD-10-CM | POA: Diagnosis not present

## 2018-09-27 DIAGNOSIS — M47816 Spondylosis without myelopathy or radiculopathy, lumbar region: Secondary | ICD-10-CM | POA: Diagnosis not present

## 2018-12-20 DIAGNOSIS — F5101 Primary insomnia: Secondary | ICD-10-CM | POA: Diagnosis not present

## 2018-12-20 DIAGNOSIS — I10 Essential (primary) hypertension: Secondary | ICD-10-CM | POA: Diagnosis not present

## 2018-12-20 DIAGNOSIS — G894 Chronic pain syndrome: Secondary | ICD-10-CM | POA: Diagnosis not present

## 2018-12-20 DIAGNOSIS — F324 Major depressive disorder, single episode, in partial remission: Secondary | ICD-10-CM | POA: Diagnosis not present

## 2019-01-18 DIAGNOSIS — M47896 Other spondylosis, lumbar region: Secondary | ICD-10-CM | POA: Diagnosis not present

## 2019-01-19 DIAGNOSIS — Z1231 Encounter for screening mammogram for malignant neoplasm of breast: Secondary | ICD-10-CM | POA: Diagnosis not present

## 2019-01-21 DIAGNOSIS — M1711 Unilateral primary osteoarthritis, right knee: Secondary | ICD-10-CM | POA: Insufficient documentation

## 2019-01-21 DIAGNOSIS — M2391 Unspecified internal derangement of right knee: Secondary | ICD-10-CM | POA: Insufficient documentation

## 2019-01-21 DIAGNOSIS — M25561 Pain in right knee: Secondary | ICD-10-CM | POA: Insufficient documentation

## 2019-01-21 DIAGNOSIS — M238X1 Other internal derangements of right knee: Secondary | ICD-10-CM | POA: Diagnosis not present

## 2019-01-21 DIAGNOSIS — Z96652 Presence of left artificial knee joint: Secondary | ICD-10-CM | POA: Diagnosis not present

## 2019-01-21 DIAGNOSIS — M25562 Pain in left knee: Secondary | ICD-10-CM | POA: Diagnosis not present

## 2019-01-25 DIAGNOSIS — M47896 Other spondylosis, lumbar region: Secondary | ICD-10-CM | POA: Diagnosis not present

## 2019-01-26 DIAGNOSIS — M47896 Other spondylosis, lumbar region: Secondary | ICD-10-CM | POA: Diagnosis not present

## 2019-02-01 DIAGNOSIS — M47896 Other spondylosis, lumbar region: Secondary | ICD-10-CM | POA: Diagnosis not present

## 2019-02-02 DIAGNOSIS — M47896 Other spondylosis, lumbar region: Secondary | ICD-10-CM | POA: Diagnosis not present

## 2019-02-08 DIAGNOSIS — M47896 Other spondylosis, lumbar region: Secondary | ICD-10-CM | POA: Diagnosis not present

## 2019-02-09 DIAGNOSIS — M47896 Other spondylosis, lumbar region: Secondary | ICD-10-CM | POA: Diagnosis not present

## 2019-02-14 DIAGNOSIS — M47896 Other spondylosis, lumbar region: Secondary | ICD-10-CM | POA: Diagnosis not present

## 2019-02-15 DIAGNOSIS — M47896 Other spondylosis, lumbar region: Secondary | ICD-10-CM | POA: Diagnosis not present

## 2019-03-21 DIAGNOSIS — F419 Anxiety disorder, unspecified: Secondary | ICD-10-CM | POA: Diagnosis not present

## 2019-03-21 DIAGNOSIS — F324 Major depressive disorder, single episode, in partial remission: Secondary | ICD-10-CM | POA: Diagnosis not present

## 2019-03-21 DIAGNOSIS — I10 Essential (primary) hypertension: Secondary | ICD-10-CM | POA: Diagnosis not present

## 2019-03-21 DIAGNOSIS — G4700 Insomnia, unspecified: Secondary | ICD-10-CM | POA: Diagnosis not present

## 2019-04-01 DIAGNOSIS — F900 Attention-deficit hyperactivity disorder, predominantly inattentive type: Secondary | ICD-10-CM | POA: Diagnosis not present

## 2019-04-01 DIAGNOSIS — F331 Major depressive disorder, recurrent, moderate: Secondary | ICD-10-CM | POA: Diagnosis not present

## 2019-05-13 DIAGNOSIS — F3342 Major depressive disorder, recurrent, in full remission: Secondary | ICD-10-CM | POA: Diagnosis not present

## 2019-05-13 DIAGNOSIS — F900 Attention-deficit hyperactivity disorder, predominantly inattentive type: Secondary | ICD-10-CM | POA: Diagnosis not present

## 2019-07-29 ENCOUNTER — Other Ambulatory Visit: Payer: Self-pay | Admitting: Family Medicine

## 2019-07-29 ENCOUNTER — Other Ambulatory Visit: Payer: Self-pay

## 2019-07-29 ENCOUNTER — Ambulatory Visit
Admission: RE | Admit: 2019-07-29 | Discharge: 2019-07-29 | Disposition: A | Discharge status: Home / Self Care | Payer: BLUE CROSS/BLUE SHIELD | Source: Ambulatory Visit | Attending: Family Medicine | Admitting: Family Medicine

## 2019-07-29 DIAGNOSIS — M542 Cervicalgia: Secondary | ICD-10-CM | POA: Diagnosis not present

## 2019-11-09 DIAGNOSIS — Z23 Encounter for immunization: Secondary | ICD-10-CM | POA: Diagnosis not present

## 2019-11-10 DIAGNOSIS — F3342 Major depressive disorder, recurrent, in full remission: Secondary | ICD-10-CM | POA: Diagnosis not present

## 2019-11-10 DIAGNOSIS — F900 Attention-deficit hyperactivity disorder, predominantly inattentive type: Secondary | ICD-10-CM | POA: Diagnosis not present

## 2019-12-08 DIAGNOSIS — M25562 Pain in left knee: Secondary | ICD-10-CM | POA: Diagnosis not present

## 2019-12-08 DIAGNOSIS — M25561 Pain in right knee: Secondary | ICD-10-CM | POA: Diagnosis not present

## 2019-12-08 DIAGNOSIS — M170 Bilateral primary osteoarthritis of knee: Secondary | ICD-10-CM | POA: Diagnosis not present

## 2019-12-12 DIAGNOSIS — R634 Abnormal weight loss: Secondary | ICD-10-CM | POA: Diagnosis not present

## 2019-12-12 DIAGNOSIS — R7303 Prediabetes: Secondary | ICD-10-CM | POA: Diagnosis not present

## 2019-12-12 DIAGNOSIS — R0981 Nasal congestion: Secondary | ICD-10-CM | POA: Diagnosis not present

## 2019-12-12 DIAGNOSIS — I10 Essential (primary) hypertension: Secondary | ICD-10-CM | POA: Diagnosis not present

## 2019-12-13 ENCOUNTER — Other Ambulatory Visit: Payer: BC Managed Care – PPO

## 2020-01-25 DIAGNOSIS — Z1231 Encounter for screening mammogram for malignant neoplasm of breast: Secondary | ICD-10-CM | POA: Diagnosis not present

## 2020-03-12 DIAGNOSIS — M238X1 Other internal derangements of right knee: Secondary | ICD-10-CM | POA: Diagnosis not present

## 2020-03-12 DIAGNOSIS — M2391 Unspecified internal derangement of right knee: Secondary | ICD-10-CM | POA: Diagnosis not present

## 2020-03-12 DIAGNOSIS — M25561 Pain in right knee: Secondary | ICD-10-CM | POA: Diagnosis not present

## 2020-03-12 DIAGNOSIS — M25562 Pain in left knee: Secondary | ICD-10-CM | POA: Diagnosis not present

## 2020-03-12 DIAGNOSIS — M1711 Unilateral primary osteoarthritis, right knee: Secondary | ICD-10-CM | POA: Diagnosis not present

## 2020-04-24 DIAGNOSIS — F411 Generalized anxiety disorder: Secondary | ICD-10-CM | POA: Diagnosis not present

## 2020-04-24 DIAGNOSIS — F3342 Major depressive disorder, recurrent, in full remission: Secondary | ICD-10-CM | POA: Diagnosis not present

## 2020-04-24 DIAGNOSIS — F900 Attention-deficit hyperactivity disorder, predominantly inattentive type: Secondary | ICD-10-CM | POA: Diagnosis not present

## 2020-04-26 DIAGNOSIS — I10 Essential (primary) hypertension: Secondary | ICD-10-CM | POA: Diagnosis not present

## 2020-04-26 DIAGNOSIS — R7303 Prediabetes: Secondary | ICD-10-CM | POA: Diagnosis not present

## 2020-05-01 DIAGNOSIS — G894 Chronic pain syndrome: Secondary | ICD-10-CM | POA: Diagnosis not present

## 2020-05-01 DIAGNOSIS — M5136 Other intervertebral disc degeneration, lumbar region: Secondary | ICD-10-CM | POA: Diagnosis not present

## 2020-05-01 DIAGNOSIS — R7303 Prediabetes: Secondary | ICD-10-CM | POA: Diagnosis not present

## 2020-05-01 DIAGNOSIS — I10 Essential (primary) hypertension: Secondary | ICD-10-CM | POA: Diagnosis not present

## 2020-12-24 DIAGNOSIS — G894 Chronic pain syndrome: Secondary | ICD-10-CM | POA: Diagnosis not present

## 2020-12-24 DIAGNOSIS — I10 Essential (primary) hypertension: Secondary | ICD-10-CM | POA: Diagnosis not present

## 2020-12-24 DIAGNOSIS — R7303 Prediabetes: Secondary | ICD-10-CM | POA: Diagnosis not present

## 2020-12-24 DIAGNOSIS — Z79899 Other long term (current) drug therapy: Secondary | ICD-10-CM | POA: Diagnosis not present

## 2020-12-24 DIAGNOSIS — M8588 Other specified disorders of bone density and structure, other site: Secondary | ICD-10-CM | POA: Diagnosis not present

## 2020-12-24 DIAGNOSIS — Z23 Encounter for immunization: Secondary | ICD-10-CM | POA: Diagnosis not present

## 2021-01-30 DIAGNOSIS — M85851 Other specified disorders of bone density and structure, right thigh: Secondary | ICD-10-CM | POA: Diagnosis not present

## 2021-01-30 DIAGNOSIS — Z1231 Encounter for screening mammogram for malignant neoplasm of breast: Secondary | ICD-10-CM | POA: Diagnosis not present

## 2021-01-30 DIAGNOSIS — M85852 Other specified disorders of bone density and structure, left thigh: Secondary | ICD-10-CM | POA: Diagnosis not present

## 2021-01-30 DIAGNOSIS — Z780 Asymptomatic menopausal state: Secondary | ICD-10-CM | POA: Diagnosis not present

## 2021-02-28 DIAGNOSIS — M25562 Pain in left knee: Secondary | ICD-10-CM | POA: Diagnosis not present

## 2021-02-28 DIAGNOSIS — M1711 Unilateral primary osteoarthritis, right knee: Secondary | ICD-10-CM | POA: Diagnosis not present

## 2021-02-28 DIAGNOSIS — Z96652 Presence of left artificial knee joint: Secondary | ICD-10-CM | POA: Diagnosis not present

## 2021-07-23 DIAGNOSIS — M5416 Radiculopathy, lumbar region: Secondary | ICD-10-CM | POA: Diagnosis not present

## 2021-08-14 DIAGNOSIS — M545 Low back pain, unspecified: Secondary | ICD-10-CM | POA: Diagnosis not present

## 2021-08-14 DIAGNOSIS — M5441 Lumbago with sciatica, right side: Secondary | ICD-10-CM | POA: Diagnosis not present

## 2021-08-14 DIAGNOSIS — M5442 Lumbago with sciatica, left side: Secondary | ICD-10-CM | POA: Diagnosis not present

## 2021-08-15 ENCOUNTER — Other Ambulatory Visit: Payer: Self-pay | Admitting: Orthopedic Surgery

## 2021-08-15 DIAGNOSIS — M545 Low back pain, unspecified: Secondary | ICD-10-CM

## 2021-08-21 DIAGNOSIS — M5416 Radiculopathy, lumbar region: Secondary | ICD-10-CM | POA: Diagnosis not present

## 2021-08-28 DIAGNOSIS — M5416 Radiculopathy, lumbar region: Secondary | ICD-10-CM | POA: Diagnosis not present

## 2021-08-31 ENCOUNTER — Other Ambulatory Visit: Payer: Self-pay

## 2021-08-31 ENCOUNTER — Ambulatory Visit
Admission: RE | Admit: 2021-08-31 | Discharge: 2021-08-31 | Disposition: A | Discharge status: Home / Self Care | Payer: Medicare Other | Source: Ambulatory Visit | Attending: Orthopedic Surgery | Admitting: Orthopedic Surgery

## 2021-08-31 DIAGNOSIS — M545 Low back pain, unspecified: Secondary | ICD-10-CM

## 2021-08-31 DIAGNOSIS — M48061 Spinal stenosis, lumbar region without neurogenic claudication: Secondary | ICD-10-CM | POA: Diagnosis not present

## 2021-09-03 DIAGNOSIS — M5416 Radiculopathy, lumbar region: Secondary | ICD-10-CM | POA: Diagnosis not present

## 2021-09-06 DIAGNOSIS — M48062 Spinal stenosis, lumbar region with neurogenic claudication: Secondary | ICD-10-CM | POA: Diagnosis not present

## 2021-09-12 DIAGNOSIS — M5416 Radiculopathy, lumbar region: Secondary | ICD-10-CM | POA: Diagnosis not present

## 2021-09-19 DIAGNOSIS — M5416 Radiculopathy, lumbar region: Secondary | ICD-10-CM | POA: Diagnosis not present

## 2021-09-26 DIAGNOSIS — M48062 Spinal stenosis, lumbar region with neurogenic claudication: Secondary | ICD-10-CM | POA: Diagnosis not present

## 2021-10-14 DIAGNOSIS — M48062 Spinal stenosis, lumbar region with neurogenic claudication: Secondary | ICD-10-CM | POA: Diagnosis not present

## 2021-10-29 DIAGNOSIS — M48062 Spinal stenosis, lumbar region with neurogenic claudication: Secondary | ICD-10-CM | POA: Diagnosis not present

## 2021-11-21 DIAGNOSIS — M48062 Spinal stenosis, lumbar region with neurogenic claudication: Secondary | ICD-10-CM | POA: Diagnosis not present

## 2021-11-21 DIAGNOSIS — M47812 Spondylosis without myelopathy or radiculopathy, cervical region: Secondary | ICD-10-CM | POA: Diagnosis not present

## 2021-11-27 DIAGNOSIS — M79642 Pain in left hand: Secondary | ICD-10-CM | POA: Diagnosis not present

## 2021-11-29 DIAGNOSIS — S139XXA Sprain of joints and ligaments of unspecified parts of neck, initial encounter: Secondary | ICD-10-CM | POA: Diagnosis not present

## 2021-12-11 DIAGNOSIS — S62655A Nondisplaced fracture of medial phalanx of left ring finger, initial encounter for closed fracture: Secondary | ICD-10-CM | POA: Diagnosis not present

## 2021-12-11 DIAGNOSIS — M20012 Mallet finger of left finger(s): Secondary | ICD-10-CM | POA: Diagnosis not present

## 2021-12-12 DIAGNOSIS — M47812 Spondylosis without myelopathy or radiculopathy, cervical region: Secondary | ICD-10-CM | POA: Diagnosis not present

## 2021-12-12 DIAGNOSIS — M48062 Spinal stenosis, lumbar region with neurogenic claudication: Secondary | ICD-10-CM | POA: Diagnosis not present

## 2021-12-13 DIAGNOSIS — M20012 Mallet finger of left finger(s): Secondary | ICD-10-CM | POA: Diagnosis not present

## 2021-12-13 DIAGNOSIS — S62655D Nondisplaced fracture of medial phalanx of left ring finger, subsequent encounter for fracture with routine healing: Secondary | ICD-10-CM | POA: Diagnosis not present

## 2021-12-27 DIAGNOSIS — S62655D Nondisplaced fracture of medial phalanx of left ring finger, subsequent encounter for fracture with routine healing: Secondary | ICD-10-CM | POA: Diagnosis not present

## 2021-12-27 DIAGNOSIS — M20012 Mallet finger of left finger(s): Secondary | ICD-10-CM | POA: Diagnosis not present

## 2022-01-02 DIAGNOSIS — M48062 Spinal stenosis, lumbar region with neurogenic claudication: Secondary | ICD-10-CM | POA: Diagnosis not present

## 2022-01-03 DIAGNOSIS — M545 Low back pain, unspecified: Secondary | ICD-10-CM | POA: Diagnosis not present

## 2022-01-03 DIAGNOSIS — M5416 Radiculopathy, lumbar region: Secondary | ICD-10-CM | POA: Diagnosis not present

## 2022-01-09 DIAGNOSIS — M79645 Pain in left finger(s): Secondary | ICD-10-CM | POA: Diagnosis not present

## 2022-01-10 DIAGNOSIS — S62655D Nondisplaced fracture of medial phalanx of left ring finger, subsequent encounter for fracture with routine healing: Secondary | ICD-10-CM | POA: Diagnosis not present

## 2022-01-10 DIAGNOSIS — M20012 Mallet finger of left finger(s): Secondary | ICD-10-CM | POA: Diagnosis not present

## 2022-02-06 DIAGNOSIS — S62655A Nondisplaced fracture of medial phalanx of left ring finger, initial encounter for closed fracture: Secondary | ICD-10-CM | POA: Diagnosis not present

## 2022-02-06 DIAGNOSIS — M20012 Mallet finger of left finger(s): Secondary | ICD-10-CM | POA: Diagnosis not present

## 2022-03-27 DIAGNOSIS — J069 Acute upper respiratory infection, unspecified: Secondary | ICD-10-CM | POA: Diagnosis not present

## 2022-04-14 DIAGNOSIS — I10 Essential (primary) hypertension: Secondary | ICD-10-CM | POA: Diagnosis not present

## 2022-04-14 DIAGNOSIS — M797 Fibromyalgia: Secondary | ICD-10-CM | POA: Diagnosis not present

## 2022-04-14 DIAGNOSIS — E559 Vitamin D deficiency, unspecified: Secondary | ICD-10-CM | POA: Diagnosis not present

## 2022-04-14 DIAGNOSIS — M8588 Other specified disorders of bone density and structure, other site: Secondary | ICD-10-CM | POA: Diagnosis not present

## 2022-04-14 DIAGNOSIS — G4700 Insomnia, unspecified: Secondary | ICD-10-CM | POA: Diagnosis not present

## 2022-04-14 DIAGNOSIS — R7303 Prediabetes: Secondary | ICD-10-CM | POA: Diagnosis not present

## 2022-04-14 DIAGNOSIS — M5136 Other intervertebral disc degeneration, lumbar region: Secondary | ICD-10-CM | POA: Diagnosis not present

## 2022-08-12 DIAGNOSIS — Z1231 Encounter for screening mammogram for malignant neoplasm of breast: Secondary | ICD-10-CM | POA: Diagnosis not present

## 2022-09-08 DIAGNOSIS — M5416 Radiculopathy, lumbar region: Secondary | ICD-10-CM | POA: Diagnosis not present

## 2022-09-12 ENCOUNTER — Other Ambulatory Visit: Payer: Self-pay | Admitting: Orthopedic Surgery

## 2022-09-25 ENCOUNTER — Other Ambulatory Visit: Payer: Self-pay

## 2022-10-07 ENCOUNTER — Encounter: Payer: Medicare Other | Admitting: Vascular Surgery

## 2022-10-09 ENCOUNTER — Other Ambulatory Visit: Payer: Self-pay

## 2022-10-14 DIAGNOSIS — M5136 Other intervertebral disc degeneration, lumbar region: Secondary | ICD-10-CM | POA: Diagnosis not present

## 2022-10-14 DIAGNOSIS — Z23 Encounter for immunization: Secondary | ICD-10-CM | POA: Diagnosis not present

## 2022-10-14 DIAGNOSIS — M19041 Primary osteoarthritis, right hand: Secondary | ICD-10-CM | POA: Diagnosis not present

## 2022-10-14 DIAGNOSIS — I10 Essential (primary) hypertension: Secondary | ICD-10-CM | POA: Diagnosis not present

## 2022-10-14 DIAGNOSIS — M19042 Primary osteoarthritis, left hand: Secondary | ICD-10-CM | POA: Diagnosis not present

## 2022-10-14 DIAGNOSIS — M797 Fibromyalgia: Secondary | ICD-10-CM | POA: Diagnosis not present

## 2022-10-14 DIAGNOSIS — F5101 Primary insomnia: Secondary | ICD-10-CM | POA: Diagnosis not present

## 2022-10-15 NOTE — Pre-Procedure Instructions (Signed)
Surgical Instructions    Your procedure is scheduled on October 29, 2022.  Report to [facility] Main Entrance "A" at 5:30 A.M., then check in with the Admitting office.  Call this number if you have problems the morning of surgery:  [phone]   If you have any questions prior to your surgery date call [phone]: Open Monday-Friday 8am-4pm    Remember:  Do not eat after midnight the night before your surgery  You may drink clear liquids until 4:30 AM the morning of your surgery.   Clear liquids allowed are: Water, Non-Citrus Juices (without pulp), Carbonated Beverages, Clear Tea, Black Coffee Only (NO MILK, CREAM OR POWDERED CREAMER of any kind), and Gatorade.  Patient Instructions  The night before surgery:  No food after midnight. ONLY clear liquids after midnight  The day of surgery (if you do NOT have diabetes):  Drink ONE (1) Pre-Surgery Clear Ensure by 4:30 AM the morning of surgery. Drink in one sitting. Do not sip.  This drink was given to you during your hospital  pre-op appointment visit.  Nothing else to drink after completing the  Pre-Surgery Clear Ensure.         If you have questions, please contact your surgeon's office.      Take these medicines the morning of surgery with A SIP OF WATER:  buPROPion (WELLBUTRIN XL sertraline (ZOLOFT)    promethazine (PHENERGAN) - may take if needed     As of today, STOP taking any Aspirin (unless otherwise instructed by your surgeon) Aleve, Naproxen, Ibuprofen, Motrin, Advil, Goody's, BC's, all herbal medications, fish oil, and all vitamins. This includes your medication: celecoxib (CELEBREX).                     Do NOT Smoke (Tobacco/Vaping) for 24 hours prior to your procedure.  If you use a CPAP at night, you may bring your mask/headgear for your overnight stay.   Contacts, glasses, piercing's, hearing aid's, dentures or partials may not be worn into surgery, please bring cases for these belongings.    For  patients admitted to the hospital, discharge time will be determined by your treatment team.   Patients discharged the day of surgery will not be allowed to drive home, and someone needs to stay with them for 24 hours.  SURGICAL WAITING ROOM VISITATION Patients having surgery or a procedure may have no more than 2 support people in the waiting area - these visitors may rotate.   Children under the age of 59 must have an adult with them who is not the patient. If the patient needs to stay at the hospital during part of their recovery, the visitor guidelines for inpatient rooms apply. Pre-op nurse will coordinate an appropriate time for 1 support person to accompany patient in pre-op.  This support person may not rotate.   Please refer to the [facility] website for the visitor guidelines for Inpatients (after your surgery is over and you are in a regular room).    Special instructions:   [facility]- Preparing For Surgery  Before surgery, you can play an important role. Because skin is not sterile, your skin needs to be as free of germs as possible. You can reduce the number of germs on your skin by washing with CHG (chlorahexidine gluconate) Soap before surgery.  CHG is an antiseptic cleaner which kills germs and bonds with the skin to continue killing germs even after washing.    Oral Hygiene is also important  to reduce your risk of infection.  Remember - BRUSH YOUR TEETH THE MORNING OF SURGERY WITH YOUR REGULAR TOOTHPASTE  Please do not use if you have an allergy to CHG or antibacterial soaps. If your skin becomes reddened/irritated stop using the CHG.  Do not shave (including legs and underarms) for at least 48 hours prior to first CHG shower. It is OK to shave your face.  Please follow these instructions carefully.   Shower the NIGHT BEFORE SURGERY and the MORNING OF SURGERY  If you chose to wash your hair, wash your hair first as usual with your normal shampoo.  After you shampoo,  rinse your hair and body thoroughly to remove the shampoo.  Use CHG Soap as you would any other liquid soap. You can apply CHG directly to the skin and wash gently with a scrungie or a clean washcloth.   Apply the CHG Soap to your body ONLY FROM THE NECK DOWN.  Do not use on open wounds or open sores. Avoid contact with your eyes, ears, mouth and genitals (private parts). Wash Face and genitals (private parts)  with your normal soap.   Wash thoroughly, paying special attention to the area where your surgery will be performed.  Thoroughly rinse your body with warm water from the neck down.  DO NOT shower/wash with your normal soap after using and rinsing off the CHG Soap.  Pat yourself dry with a CLEAN TOWEL.  Wear CLEAN PAJAMAS to bed the night before surgery  Place CLEAN SHEETS on your bed the night before your surgery  DO NOT SLEEP WITH PETS.   Day of Surgery: Take a shower with CHG soap. Do not wear jewelry or makeup Do not wear lotions, powders, perfumes/colognes, or deodorant. Do not shave 48 hours prior to surgery.  Men may shave face and neck. Do not bring valuables to the hospital.  [facility] is not responsible for any belongings or valuables. Do not wear nail polish, gel polish, artificial nails, or any other type of covering on natural nails (fingers and toes) If you have artificial nails or gel coating that need to be removed by a nail salon, please have this removed prior to surgery. Artificial nails or gel coating may interfere with anesthesia's ability to adequately monitor your vital signs.  Wear Clean/Comfortable clothing the morning of surgery Remember to brush your teeth WITH YOUR REGULAR TOOTHPASTE.   Please read over the following fact sheets that you were given.    If you received a COVID test during your pre-op visit  it is requested that you wear a mask when out in public, stay away from anyone that may not be feeling well and notify your surgeon if you  develop symptoms. If you have been in contact with anyone that has tested positive in the last 10 days please notify you surgeon.

## 2022-10-16 ENCOUNTER — Inpatient Hospital Stay (HOSPITAL_COMMUNITY)
Admission: RE | Admit: 2022-10-16 | Discharge: 2022-10-16 | Disposition: A | Discharge status: Home / Self Care | Payer: Medicare Other | Source: Ambulatory Visit

## 2022-10-21 ENCOUNTER — Encounter: Payer: Self-pay | Admitting: Vascular Surgery

## 2022-10-21 ENCOUNTER — Ambulatory Visit (INDEPENDENT_AMBULATORY_CARE_PROVIDER_SITE_OTHER): Payer: Medicare Other | Admitting: Vascular Surgery

## 2022-10-21 VITALS — BP 155/86 | HR 80 | Temp 98.2°F | Resp 14 | Ht 61.0 in | Wt 117.0 lb

## 2022-10-21 DIAGNOSIS — G8929 Other chronic pain: Secondary | ICD-10-CM

## 2022-10-21 DIAGNOSIS — M5441 Lumbago with sciatica, right side: Secondary | ICD-10-CM

## 2022-10-21 DIAGNOSIS — M5442 Lumbago with sciatica, left side: Secondary | ICD-10-CM | POA: Diagnosis not present

## 2022-10-21 NOTE — Progress Notes (Signed)
Patient name: Whitney Gregory MRN: [id number] DOB: 12/06/1955 Sex: female  REASON FOR CONSULT: Evaluate for abdominal exposure for L4-S1 ALIF  HPI: Whitney Gregory is a 66 y.o. female, with chronic lower back pain that presents for evaluation of abdominal exposure for L4-S1 ALIF.  Patient describes right leg radicular leg pain as well as some left leg radicular leg pain.  She has failed conservative management.  She has been evaluated Dr. Lynann Bologna who has recommended an L4-L5 and L5-S1 ALIF with vascular surgery to assist from an anterior approach for the initial stage.  There as a second stage with a lateral interbody fusion at L2-L3 and L3-L4 and a posterior fusion at L1-L2.  She states her only previous abdominal surgery is a partial hysterectomy as well as a laparoscopy for endometriosis.  No abdominal wall mesh.    Past Medical History:  Diagnosis Date   Anemia    hx of    Anxiety    Arthritis    Bilateral leg pain    Cervicalgia    cervical surgery 1999 & 2000   Depression    Fibromyalgia    GERD (gastroesophageal reflux disease)    occasional     Past Surgical History:  Procedure Laterality Date   ABDOMINAL HYSTERECTOMY     DILATION AND CURETTAGE OF UTERUS     NECK SURGERY     OTHER SURGICAL HISTORY     lapararoscopies for endometriosis    PARTIAL KNEE ARTHROPLASTY  04/27/2012   Procedure: UNICOMPARTMENTAL KNEE;  Surgeon: Mauri Pole, MD;  Location: [facility];  Service: Orthopedics;  Laterality: Left;   TONSILLECTOMY      Family History  Problem Relation Age of Onset   Anesthesia problems Mother    Hypertension Father     SOCIAL HISTORY: Social History   Socioeconomic History   Marital status: Divorced    Spouse name: Not on file   Number of children: Not on file   Years of education: Not on file   Highest education level: Not on file  Occupational History   Not on file  Tobacco Use   Smoking status: Never   Smokeless tobacco: Never  Substance and  Sexual Activity   Alcohol use: Yes    Alcohol/week: 2.0 standard drinks of alcohol    Types: 2 Glasses of wine per week   Drug use: No   Sexual activity: Not on file  Other Topics Concern   Not on file  Social History Narrative   Not on file   Social Determinants of Health   Financial Resource Strain: Not on file  Food Insecurity: Not on file  Transportation Needs: Not on file  Physical Activity: Not on file  Stress: Not on file  Social Connections: Not on file  Intimate Partner Violence: Not on file    Allergies  Allergen Reactions   Prednisone Other (See Comments)    Makes pt feel like "top of head is going to blow off", small doses seem to be tolerable, but pt prefers not to take     Current Outpatient Medications  Medication Sig Dispense Refill   amphetamine-dextroamphetamine (ADDERALL XR) 30 MG 24 hr capsule Take 30 mg by mouth every morning.     amphetamine-dextroamphetamine (ADDERALL) 30 MG tablet Take 30 mg by mouth daily.     buPROPion (WELLBUTRIN XL) 300 MG 24 hr tablet Take 300 mg by mouth every morning.     celecoxib (CELEBREX) 200 MG capsule Take 1 capsule (  200 mg total) by mouth 2 (two) times daily. (Patient taking differently: Take 200 mg by mouth daily.)     promethazine (PHENERGAN) 25 MG tablet Take 25 mg by mouth every 6 (six) hours as needed for nausea.   0   sertraline (ZOLOFT) 100 MG tablet Take 200 mg by mouth daily.  4   temazepam (RESTORIL) 30 MG capsule Take 30 mg by mouth at bedtime.     valsartan-hydrochlorothiazide (DIOVAN-HCT) 160-12.5 MG tablet Take 1 tablet by mouth at bedtime.     No current facility-administered medications for this visit.    REVIEW OF SYSTEMS:  [X]  denotes positive finding, [ ]  denotes negative finding Cardiac  Comments:  Chest pain or chest pressure:    Shortness of breath upon exertion:    Short of breath when lying flat:    Irregular heart rhythm:        Vascular    Pain in calf, thigh, or hip brought on by  ambulation:    Pain in feet at night that wakes you up from your sleep:     Blood clot in your veins:    Leg swelling:         Pulmonary    Oxygen at home:    Productive cough:     Wheezing:         Neurologic    Sudden weakness in arms or legs:     Sudden numbness in arms or legs:     Sudden onset of difficulty speaking or slurred speech:    Temporary loss of vision in one eye:     Problems with dizziness:         Gastrointestinal    Blood in stool:     Vomited blood:         Genitourinary    Burning when urinating:     Blood in urine:        Psychiatric    Major depression:         Hematologic    Bleeding problems:    Problems with blood clotting too easily:        Skin    Rashes or ulcers:        Constitutional    Fever or chills:      PHYSICAL EXAM: Vitals:   10/21/22 1530  BP: (!) 155/86  Pulse: 80  Resp: 14  Temp: 98.2 F (36.8 C)  TempSrc: Temporal  SpO2: 95%  Weight: 117 lb (53.1 kg)  Height: 5\' 1"  (1.549 m)    GENERAL: The patient is a well-nourished female, in no acute distress. The vital signs are documented above. CARDIAC: There is a regular rate and rhythm.  VASCULAR:  Palpable femoral pulses bilaterally Palpable DP pulses bilaterally PULMONARY: No respiratory distress. ABDOMEN: Soft and non-tender.  MUSCULOSKELETAL: There are no major deformities or cyanosis. NEUROLOGIC: No focal weakness or paresthesias are detected. PSYCHIATRIC: The patient has a normal affect.  DATA:   MRI reviewed from 08/31/21:    Assessment/Plan:  66 year old female with chronic lower back pain with lower extremity radiculopathy that presents for preop evaluation of abdominal exposure for L4-L5 and L5-S1 ALIF.  I have reviewed her MRI imaging and discussed I think she would be a good candidate for anterior approach.  Discussed paramedian incision over the left rectus muscle and then mobilizing the left rectus to enter the retroperitoneum and mobilizing  peritoneum and left ureter across midline.  Discussed moving the iliac artery and vein and risk of  injury to the above structures.  All questions answered.  Look forward to assisting Dr. Lynann Bologna next week.   Marty Heck, MD [facility] Office: [phone]

## 2022-10-22 ENCOUNTER — Other Ambulatory Visit: Payer: Self-pay

## 2022-10-22 ENCOUNTER — Encounter (HOSPITAL_COMMUNITY): Payer: Self-pay

## 2022-10-22 ENCOUNTER — Encounter (HOSPITAL_COMMUNITY)
Admission: RE | Admit: 2022-10-22 | Discharge: 2022-10-22 | Disposition: A | Discharge status: Home / Self Care | Payer: Medicare Other | Source: Ambulatory Visit | Attending: Orthopedic Surgery | Admitting: Orthopedic Surgery

## 2022-10-22 VITALS — BP 148/77 | HR 81 | Temp 97.5°F | Resp 16 | Ht 60.25 in | Wt 118.6 lb

## 2022-10-22 DIAGNOSIS — I10 Essential (primary) hypertension: Secondary | ICD-10-CM | POA: Insufficient documentation

## 2022-10-22 DIAGNOSIS — Z01818 Encounter for other preprocedural examination: Secondary | ICD-10-CM | POA: Diagnosis not present

## 2022-10-22 DIAGNOSIS — Z789 Other specified health status: Secondary | ICD-10-CM | POA: Diagnosis not present

## 2022-10-22 HISTORY — DX: Family history of other specified conditions: Z84.89

## 2022-10-22 HISTORY — DX: Attention-deficit hyperactivity disorder, unspecified type: F90.9

## 2022-10-22 HISTORY — DX: Essential (primary) hypertension: I10

## 2022-10-22 LAB — TYPE AND SCREEN
ABO/RH(D): O NEG
Antibody Screen: NEGATIVE

## 2022-10-22 LAB — COMPREHENSIVE METABOLIC PANEL
ALT: 15 U/L (ref 0–44)
AST: 21 U/L (ref 15–41)
Albumin: 4.2 g/dL (ref 3.5–5.0)
Alkaline Phosphatase: 89 U/L (ref 38–126)
Anion gap: 7 (ref 5–15)
BUN: 14 mg/dL (ref 8–23)
CO2: 24 mmol/L (ref 22–32)
Calcium: 9 mg/dL (ref 8.9–10.3)
Chloride: 106 mmol/L (ref 98–111)
Creatinine, Ser: 0.72 mg/dL (ref 0.44–1.00)
GFR, Estimated: >60 mL/min (ref >60)
Glucose, Bld: 127 mg/dL — ABNORMAL HIGH (ref 70–99)
Potassium: 4 mmol/L (ref 3.5–5.1)
Sodium: 137 mmol/L (ref 135–145)
Total Bilirubin: 0.4 mg/dL (ref 0.3–1.2)
Total Protein: 7 g/dL (ref 6.5–8.1)

## 2022-10-22 LAB — SURGICAL PCR SCREEN
MRSA, PCR: NEGATIVE
Staphylococcus aureus: NEGATIVE

## 2022-10-22 LAB — CBC
HCT: 39.8 % (ref 36.0–46.0)
Hemoglobin: 13.2 g/dL (ref 12.0–15.0)
MCH: 30.6 pg (ref 26.0–34.0)
MCHC: 33.2 g/dL (ref 30.0–36.0)
MCV: 92.1 fL (ref 80.0–100.0)
Platelets: 248 10*3/uL (ref 150–400)
RBC: 4.32 MIL/uL (ref 3.87–5.11)
RDW: 12.8 % (ref 11.5–15.5)
WBC: 5.4 10*3/uL (ref 4.0–10.5)
nRBC: 0 % (ref 0.0–0.2)

## 2022-10-22 NOTE — Progress Notes (Signed)
PCP - Dr. Laurann Montana Cardiologist - denies  PPM/ICD - denies   Chest x-ray - 06/12/15 EKG - 10/22/22 Stress Test - 06/19/15 ECHO - denies Cardiac Cath - denies  Sleep Study - denies  DM- denies  Last dose of GLP1 agonist-  n/a   ASA/Blood Thinner Instructions: n/a   ERAS Protcol - yes PRE-SURGERY Ensure given at PAT  COVID TEST- n/a   Anesthesia review: no  Patient denies shortness of breath, fever, cough and chest pain at PAT appointment   All instructions explained to the patient, with a verbal understanding of the material. Patient agrees to go over the instructions while at home for a better understanding. The opportunity to ask questions was provided.

## 2022-10-29 ENCOUNTER — Inpatient Hospital Stay (HOSPITAL_COMMUNITY)
Admission: RE | Admit: 2022-10-29 | Discharge: 2022-10-31 | DRG: 455 | Disposition: A | Discharge status: Home / Self Care | Payer: Medicare Other | Attending: Orthopedic Surgery | Admitting: Orthopedic Surgery

## 2022-10-29 ENCOUNTER — Encounter (HOSPITAL_COMMUNITY)
Admission: RE | Disposition: A | Discharge status: Home / Self Care | Payer: Self-pay | Source: Home / Self Care | Attending: Orthopedic Surgery

## 2022-10-29 ENCOUNTER — Inpatient Hospital Stay (HOSPITAL_COMMUNITY): Payer: Medicare Other

## 2022-10-29 ENCOUNTER — Other Ambulatory Visit: Payer: Self-pay

## 2022-10-29 ENCOUNTER — Inpatient Hospital Stay (HOSPITAL_COMMUNITY): Payer: Medicare Other | Admitting: Vascular Surgery

## 2022-10-29 ENCOUNTER — Encounter (HOSPITAL_COMMUNITY): Payer: Self-pay | Admitting: Orthopedic Surgery

## 2022-10-29 ENCOUNTER — Inpatient Hospital Stay (HOSPITAL_COMMUNITY): Payer: Medicare Other | Admitting: Anesthesiology

## 2022-10-29 DIAGNOSIS — M5416 Radiculopathy, lumbar region: Secondary | ICD-10-CM

## 2022-10-29 DIAGNOSIS — M4326 Fusion of spine, lumbar region: Secondary | ICD-10-CM | POA: Diagnosis not present

## 2022-10-29 DIAGNOSIS — Z888 Allergy status to other drugs, medicaments and biological substances status: Secondary | ICD-10-CM | POA: Diagnosis not present

## 2022-10-29 DIAGNOSIS — M797 Fibromyalgia: Secondary | ICD-10-CM | POA: Diagnosis present

## 2022-10-29 DIAGNOSIS — G8929 Other chronic pain: Secondary | ICD-10-CM | POA: Diagnosis not present

## 2022-10-29 DIAGNOSIS — M4807 Spinal stenosis, lumbosacral region: Secondary | ICD-10-CM | POA: Diagnosis present

## 2022-10-29 DIAGNOSIS — I10 Essential (primary) hypertension: Secondary | ICD-10-CM | POA: Diagnosis present

## 2022-10-29 DIAGNOSIS — F909 Attention-deficit hyperactivity disorder, unspecified type: Secondary | ICD-10-CM | POA: Diagnosis present

## 2022-10-29 DIAGNOSIS — M4186 Other forms of scoliosis, lumbar region: Secondary | ICD-10-CM

## 2022-10-29 DIAGNOSIS — Z8249 Family history of ischemic heart disease and other diseases of the circulatory system: Secondary | ICD-10-CM

## 2022-10-29 DIAGNOSIS — M5417 Radiculopathy, lumbosacral region: Secondary | ICD-10-CM | POA: Diagnosis not present

## 2022-10-29 DIAGNOSIS — F32A Depression, unspecified: Secondary | ICD-10-CM | POA: Diagnosis not present

## 2022-10-29 DIAGNOSIS — M48061 Spinal stenosis, lumbar region without neurogenic claudication: Secondary | ICD-10-CM | POA: Diagnosis not present

## 2022-10-29 DIAGNOSIS — Z96652 Presence of left artificial knee joint: Secondary | ICD-10-CM | POA: Diagnosis not present

## 2022-10-29 DIAGNOSIS — Z791 Long term (current) use of non-steroidal anti-inflammatories (NSAID): Secondary | ICD-10-CM

## 2022-10-29 DIAGNOSIS — Z79899 Other long term (current) drug therapy: Secondary | ICD-10-CM

## 2022-10-29 DIAGNOSIS — M4156 Other secondary scoliosis, lumbar region: Secondary | ICD-10-CM | POA: Diagnosis not present

## 2022-10-29 HISTORY — PX: ABDOMINAL EXPOSURE: SHX5708

## 2022-10-29 HISTORY — PX: ANTERIOR LUMBAR FUSION: SHX1170

## 2022-10-29 SURGERY — ANTERIOR LUMBAR FUSION 2 LEVELS
Anesthesia: General

## 2022-10-29 MED ORDER — PROPOFOL 500 MG/50ML IV EMUL
INTRAVENOUS | Status: DC | PRN
  Administered 2022-10-29: 15 ug/kg/min via INTRAVENOUS

## 2022-10-29 MED ORDER — AMPHETAMINE-DEXTROAMPHET ER 10 MG PO CP24
30.0000 mg | ORAL_CAPSULE | Freq: Every morning | ORAL | Status: DC
  Filled 2022-10-29: qty 3

## 2022-10-29 MED ORDER — HYDROCHLOROTHIAZIDE 12.5 MG PO TABS
12.5000 mg | ORAL_TABLET | Freq: Every day | ORAL | Status: DC
  Administered 2022-10-29 – 2022-10-30 (×2): 12.5 mg via ORAL
  Filled 2022-10-29 (×2): qty 1

## 2022-10-29 MED ORDER — BISACODYL 5 MG PO TBEC: 5.0000 mg | DELAYED_RELEASE_TABLET | Freq: Every day | ORAL | Status: DC | PRN

## 2022-10-29 MED ORDER — OXYCODONE-ACETAMINOPHEN 5-325 MG PO TABS
1.0000 | ORAL_TABLET | ORAL | Status: DC | PRN
  Administered 2022-10-30 – 2022-10-31 (×4): 2 via ORAL
  Filled 2022-10-29 (×4): qty 2

## 2022-10-29 MED ORDER — IRBESARTAN 150 MG PO TABS
150.0000 mg | ORAL_TABLET | Freq: Every day | ORAL | Status: DC
  Administered 2022-10-29 – 2022-10-30 (×2): 150 mg via ORAL
  Filled 2022-10-29 (×2): qty 1

## 2022-10-29 MED ORDER — ONDANSETRON HCL 4 MG/2ML IJ SOLN: 4.0000 mg | Freq: Once | INTRAMUSCULAR | Status: DC | PRN

## 2022-10-29 MED ORDER — CHLORHEXIDINE GLUCONATE CLOTH 2 % EX PADS: 6.0000 | MEDICATED_PAD | Freq: Once | CUTANEOUS | Status: DC

## 2022-10-29 MED ORDER — KETAMINE HCL 50 MG/5ML IJ SOSY
PREFILLED_SYRINGE | INTRAMUSCULAR | Status: AC
  Filled 2022-10-29: qty 5

## 2022-10-29 MED ORDER — ONDANSETRON HCL 4 MG/2ML IJ SOLN
INTRAMUSCULAR | Status: DC | PRN
  Administered 2022-10-29: 4 mg via INTRAVENOUS

## 2022-10-29 MED ORDER — 0.9 % SODIUM CHLORIDE (POUR BTL) OPTIME
TOPICAL | Status: DC | PRN
  Administered 2022-10-29: 1000 mL

## 2022-10-29 MED ORDER — METHOCARBAMOL 500 MG PO TABS
500.0000 mg | ORAL_TABLET | Freq: Four times a day (QID) | ORAL | Status: DC | PRN
  Administered 2022-10-30 – 2022-10-31 (×3): 500 mg via ORAL
  Filled 2022-10-29 (×3): qty 1

## 2022-10-29 MED ORDER — HYDROMORPHONE HCL 1 MG/ML IJ SOLN: 0.2500 mg | INTRAMUSCULAR | Status: DC | PRN

## 2022-10-29 MED ORDER — ROCURONIUM BROMIDE 10 MG/ML (PF) SYRINGE
PREFILLED_SYRINGE | INTRAVENOUS | Status: DC | PRN
  Administered 2022-10-29: 20 mg via INTRAVENOUS
  Administered 2022-10-29: 100 mg via INTRAVENOUS

## 2022-10-29 MED ORDER — ACETAMINOPHEN 650 MG RE SUPP: 650.0000 mg | RECTAL | Status: DC | PRN

## 2022-10-29 MED ORDER — LIDOCAINE 2% (20 MG/ML) 5 ML SYRINGE
INTRAMUSCULAR | Status: AC
  Filled 2022-10-29: qty 5

## 2022-10-29 MED ORDER — ONDANSETRON HCL 4 MG/2ML IJ SOLN: 4.0000 mg | Freq: Four times a day (QID) | INTRAMUSCULAR | Status: DC | PRN

## 2022-10-29 MED ORDER — THROMBIN (RECOMBINANT) 20000 UNITS EX SOLR
CUTANEOUS | Status: AC
  Filled 2022-10-29: qty 20000

## 2022-10-29 MED ORDER — VALSARTAN-HYDROCHLOROTHIAZIDE 160-12.5 MG PO TABS: 1.0000 | ORAL_TABLET | Freq: Every day | ORAL | Status: DC

## 2022-10-29 MED ORDER — SODIUM CHLORIDE 0.9% FLUSH
3.0000 mL | Freq: Two times a day (BID) | INTRAVENOUS | Status: DC
  Administered 2022-10-29 – 2022-10-30 (×3): 3 mL via INTRAVENOUS

## 2022-10-29 MED ORDER — PHENYLEPHRINE HCL-NACL 20-0.9 MG/250ML-% IV SOLN
INTRAVENOUS | Status: DC | PRN
  Administered 2022-10-29: 50 ug/min via INTRAVENOUS

## 2022-10-29 MED ORDER — SERTRALINE HCL 50 MG PO TABS
200.0000 mg | ORAL_TABLET | Freq: Every day | ORAL | Status: DC
  Administered 2022-10-29 – 2022-10-31 (×2): 200 mg via ORAL
  Filled 2022-10-29 (×2): qty 4

## 2022-10-29 MED ORDER — PHENYLEPHRINE HCL (PRESSORS) 10 MG/ML IV SOLN
INTRAVENOUS | Status: DC | PRN
  Administered 2022-10-29 (×8): 80 ug via INTRAVENOUS
  Administered 2022-10-29: 240 ug via INTRAVENOUS

## 2022-10-29 MED ORDER — LACTATED RINGERS IV SOLN: INTRAVENOUS | Status: DC | PRN

## 2022-10-29 MED ORDER — DEXMEDETOMIDINE HCL IN NACL 80 MCG/20ML IV SOLN
INTRAVENOUS | Status: DC | PRN
  Administered 2022-10-29: 8 ug via BUCCAL
  Administered 2022-10-29: 4 ug via BUCCAL
  Administered 2022-10-29: 8 ug via BUCCAL

## 2022-10-29 MED ORDER — MIDAZOLAM HCL 2 MG/2ML IJ SOLN
INTRAMUSCULAR | Status: AC
  Filled 2022-10-29: qty 2

## 2022-10-29 MED ORDER — MIDAZOLAM HCL 2 MG/2ML IJ SOLN
INTRAMUSCULAR | Status: DC | PRN
  Administered 2022-10-29: 2 mg via INTRAVENOUS

## 2022-10-29 MED ORDER — BUPROPION HCL ER (XL) 300 MG PO TB24
300.0000 mg | ORAL_TABLET | Freq: Every morning | ORAL | Status: DC
  Administered 2022-10-29 – 2022-10-31 (×2): 300 mg via ORAL
  Filled 2022-10-29 (×2): qty 1

## 2022-10-29 MED ORDER — ACETAMINOPHEN 500 MG PO TABS: 1000.0000 mg | ORAL_TABLET | Freq: Once | ORAL | Status: DC

## 2022-10-29 MED ORDER — MENTHOL 3 MG MT LOZG: 1.0000 | LOZENGE | OROMUCOSAL | Status: DC | PRN

## 2022-10-29 MED ORDER — KETAMINE HCL 10 MG/ML IJ SOLN
INTRAMUSCULAR | Status: DC | PRN
  Administered 2022-10-29 (×4): 10 mg via INTRAVENOUS

## 2022-10-29 MED ORDER — ALBUMIN HUMAN 5 % IV SOLN: INTRAVENOUS | Status: DC | PRN

## 2022-10-29 MED ORDER — OXYCODONE HCL 5 MG/5ML PO SOLN: 5.0000 mg | Freq: Once | ORAL | Status: AC | PRN

## 2022-10-29 MED ORDER — HYDROCODONE-ACETAMINOPHEN 5-325 MG PO TABS
1.0000 | ORAL_TABLET | ORAL | Status: DC | PRN
  Administered 2022-10-29 – 2022-10-30 (×4): 2 via ORAL
  Filled 2022-10-29 (×4): qty 2

## 2022-10-29 MED ORDER — METHOCARBAMOL 1000 MG/10ML IJ SOLN: 500.0000 mg | Freq: Four times a day (QID) | INTRAVENOUS | Status: DC | PRN

## 2022-10-29 MED ORDER — ALUM & MAG HYDROXIDE-SIMETH 200-200-20 MG/5ML PO SUSP: 30.0000 mL | Freq: Four times a day (QID) | ORAL | Status: DC | PRN

## 2022-10-29 MED ORDER — ENSURE PRE-SURGERY PO LIQD
296.0000 mL | Freq: Once | ORAL | Status: AC
  Administered 2022-10-30: 296 mL via ORAL
  Filled 2022-10-29 (×2): qty 296

## 2022-10-29 MED ORDER — PROMETHAZINE HCL 25 MG PO TABS: 25.0000 mg | ORAL_TABLET | Freq: Four times a day (QID) | ORAL | Status: DC | PRN

## 2022-10-29 MED ORDER — ONDANSETRON HCL 4 MG PO TABS: 4.0000 mg | ORAL_TABLET | Freq: Four times a day (QID) | ORAL | Status: DC | PRN

## 2022-10-29 MED ORDER — FLEET ENEMA 7-19 GM/118ML RE ENEM: 1.0000 | ENEMA | Freq: Once | RECTAL | Status: DC | PRN

## 2022-10-29 MED ORDER — ZOLPIDEM TARTRATE 5 MG PO TABS: 5.0000 mg | ORAL_TABLET | Freq: Every evening | ORAL | Status: DC | PRN

## 2022-10-29 MED ORDER — OXYCODONE HCL 5 MG PO TABS
ORAL_TABLET | ORAL | Status: AC
  Filled 2022-10-29: qty 1

## 2022-10-29 MED ORDER — BUPIVACAINE-EPINEPHRINE (PF) 0.25% -1:200000 IJ SOLN
INTRAMUSCULAR | Status: AC
  Filled 2022-10-29: qty 30

## 2022-10-29 MED ORDER — CHLORHEXIDINE GLUCONATE 0.12 % MT SOLN
15.0000 mL | Freq: Once | OROMUCOSAL | Status: AC
  Administered 2022-10-29: 15 mL via OROMUCOSAL
  Filled 2022-10-29: qty 15

## 2022-10-29 MED ORDER — PHENYLEPHRINE 80 MCG/ML (10ML) SYRINGE FOR IV PUSH (FOR BLOOD PRESSURE SUPPORT)
PREFILLED_SYRINGE | INTRAVENOUS | Status: AC
  Filled 2022-10-29: qty 10

## 2022-10-29 MED ORDER — DOCUSATE SODIUM 100 MG PO CAPS
100.0000 mg | ORAL_CAPSULE | Freq: Two times a day (BID) | ORAL | Status: DC
  Administered 2022-10-29 – 2022-10-31 (×4): 100 mg via ORAL
  Filled 2022-10-29 (×4): qty 1

## 2022-10-29 MED ORDER — PROPOFOL 10 MG/ML IV BOLUS
INTRAVENOUS | Status: AC
  Filled 2022-10-29: qty 20

## 2022-10-29 MED ORDER — OXYCODONE HCL 5 MG PO TABS
5.0000 mg | ORAL_TABLET | Freq: Once | ORAL | Status: AC | PRN
  Administered 2022-10-29: 5 mg via ORAL

## 2022-10-29 MED ORDER — CEFAZOLIN SODIUM-DEXTROSE 2-4 GM/100ML-% IV SOLN
2.0000 g | INTRAVENOUS | Status: AC
  Administered 2022-10-29: 2 g via INTRAVENOUS
  Filled 2022-10-29: qty 100

## 2022-10-29 MED ORDER — LIDOCAINE 2% (20 MG/ML) 5 ML SYRINGE
INTRAMUSCULAR | Status: DC | PRN
  Administered 2022-10-29: 50 mg via INTRAVENOUS

## 2022-10-29 MED ORDER — ONDANSETRON HCL 4 MG/2ML IJ SOLN
INTRAMUSCULAR | Status: AC
  Filled 2022-10-29: qty 2

## 2022-10-29 MED ORDER — PROPOFOL 10 MG/ML IV BOLUS
INTRAVENOUS | Status: DC | PRN
  Administered 2022-10-29: 50 mg via INTRAVENOUS
  Administered 2022-10-29: 150 mg via INTRAVENOUS

## 2022-10-29 MED ORDER — SODIUM CHLORIDE 0.9 % IV SOLN: 250.0000 mL | INTRAVENOUS | Status: DC

## 2022-10-29 MED ORDER — LACTATED RINGERS IV SOLN: INTRAVENOUS | Status: DC

## 2022-10-29 MED ORDER — TEMAZEPAM 7.5 MG PO CAPS
30.0000 mg | ORAL_CAPSULE | Freq: Every day | ORAL | Status: DC
  Administered 2022-10-29 – 2022-10-30 (×2): 30 mg via ORAL
  Filled 2022-10-29 (×2): qty 4

## 2022-10-29 MED ORDER — METHOCARBAMOL 500 MG PO TABS
ORAL_TABLET | ORAL | Status: AC
  Administered 2022-10-29: 500 mg via ORAL
  Filled 2022-10-29: qty 1

## 2022-10-29 MED ORDER — SENNOSIDES-DOCUSATE SODIUM 8.6-50 MG PO TABS
1.0000 | ORAL_TABLET | Freq: Every evening | ORAL | Status: DC | PRN
  Administered 2022-10-30: 1 via ORAL
  Filled 2022-10-29: qty 1

## 2022-10-29 MED ORDER — POTASSIUM CHLORIDE IN NACL 20-0.9 MEQ/L-% IV SOLN
INTRAVENOUS | Status: DC
  Filled 2022-10-29: qty 1000

## 2022-10-29 MED ORDER — ROCURONIUM BROMIDE 10 MG/ML (PF) SYRINGE
PREFILLED_SYRINGE | INTRAVENOUS | Status: AC
  Filled 2022-10-29: qty 10

## 2022-10-29 MED ORDER — PHENOL 1.4 % MT LIQD: 1.0000 | OROMUCOSAL | Status: DC | PRN

## 2022-10-29 MED ORDER — AMISULPRIDE (ANTIEMETIC) 5 MG/2ML IV SOLN: 10.0000 mg | Freq: Once | INTRAVENOUS | Status: DC | PRN

## 2022-10-29 MED ORDER — MORPHINE SULFATE (PF) 2 MG/ML IV SOLN
1.0000 mg | INTRAVENOUS | Status: DC | PRN
  Administered 2022-10-30: 2 mg via INTRAVENOUS
  Filled 2022-10-29: qty 1

## 2022-10-29 MED ORDER — CEFAZOLIN SODIUM-DEXTROSE 2-4 GM/100ML-% IV SOLN
2.0000 g | Freq: Three times a day (TID) | INTRAVENOUS | Status: AC
  Administered 2022-10-29 (×2): 2 g via INTRAVENOUS
  Filled 2022-10-29 (×2): qty 100

## 2022-10-29 MED ORDER — ORAL CARE MOUTH RINSE: 15.0000 mL | Freq: Once | OROMUCOSAL | Status: AC

## 2022-10-29 MED ORDER — FENTANYL CITRATE (PF) 250 MCG/5ML IJ SOLN
INTRAMUSCULAR | Status: DC | PRN
  Administered 2022-10-29: 100 ug via INTRAVENOUS
  Administered 2022-10-29 (×3): 50 ug via INTRAVENOUS

## 2022-10-29 MED ORDER — SODIUM CHLORIDE 0.9% FLUSH: 3.0000 mL | INTRAVENOUS | Status: DC | PRN

## 2022-10-29 MED ORDER — FENTANYL CITRATE (PF) 250 MCG/5ML IJ SOLN
INTRAMUSCULAR | Status: AC
  Filled 2022-10-29: qty 5

## 2022-10-29 MED ORDER — PROPOFOL 1000 MG/100ML IV EMUL
INTRAVENOUS | Status: AC
  Filled 2022-10-29: qty 100

## 2022-10-29 MED ORDER — ACETAMINOPHEN 325 MG PO TABS: 650.0000 mg | ORAL_TABLET | ORAL | Status: DC | PRN

## 2022-10-29 MED ORDER — DEXAMETHASONE SODIUM PHOSPHATE 10 MG/ML IJ SOLN
INTRAMUSCULAR | Status: AC
  Filled 2022-10-29: qty 1

## 2022-10-29 MED ORDER — POVIDONE-IODINE 7.5 % EX SOLN
Freq: Once | CUTANEOUS | Status: DC
  Filled 2022-10-29: qty 118

## 2022-10-29 MED ORDER — SUGAMMADEX SODIUM 200 MG/2ML IV SOLN
INTRAVENOUS | Status: DC | PRN
  Administered 2022-10-29: 200 mg via INTRAVENOUS

## 2022-10-29 SURGICAL SUPPLY — 83 items
APPLIER CLIP 11 MED OPEN (CLIP) ×2
APR CLP MED 11 20 MLT OPN (CLIP) ×2
BAG COUNTER SPONGE SURGICOUNT (BAG) ×2 IMPLANT
BAG SPNG CNTER NS LX DISP (BAG) ×2
CLIP APPLIE 11 MED OPEN (CLIP) ×2 IMPLANT
CLIP LIGATING EXTRA MED SLVR (CLIP) IMPLANT
CLSR STERI-STRIP ANTIMIC 1/2X4 (GAUZE/BANDAGES/DRESSINGS) IMPLANT
CORD BIPOLAR FORCEPS 12FT (ELECTRODE) ×1 IMPLANT
COVER MAYO STAND STRL (DRAPES) IMPLANT
COVER SURGICAL LIGHT HANDLE (MISCELLANEOUS) ×1 IMPLANT
DRAPE C-ARM 42X72 X-RAY (DRAPES) ×2 IMPLANT
DRAPE POUCH INSTRU U-SHP 10X18 (DRAPES) ×1 IMPLANT
DRAPE SURG 17X23 STRL (DRAPES) ×3 IMPLANT
DRSG MEPILEX BORDER 4X12 (GAUZE/BANDAGES/DRESSINGS) ×1 IMPLANT
DURAPREP 26ML APPLICATOR (WOUND CARE) ×1 IMPLANT
ELECT BLADE 4.0 EZ CLEAN MEGAD (MISCELLANEOUS) ×2
ELECT REM PT RETURN 9FT ADLT (ELECTROSURGICAL) ×1
ELECTRODE BLDE 4.0 EZ CLN MEGD (MISCELLANEOUS) ×2 IMPLANT
ELECTRODE REM PT RTRN 9FT ADLT (ELECTROSURGICAL) ×1 IMPLANT
FUNNEL BONE TUBE 6000 GL (ORTHOPEDIC DISPOSABLE SUPPLIES) IMPLANT
FUNNEL BONE TUBE 6001 GL (ORTHOPEDIC DISPOSABLE SUPPLIES) IMPLANT
FUNNEL BONE TUBE 6002 GL (ORTHOPEDIC DISPOSABLE SUPPLIES) IMPLANT
GAUZE SPONGE 4X4 12PLY STRL LF (GAUZE/BANDAGES/DRESSINGS) IMPLANT
GLOVE BIO SURGEON STRL SZ7 (GLOVE) ×1 IMPLANT
GLOVE BIO SURGEON STRL SZ7.5 (GLOVE) ×1 IMPLANT
GLOVE BIO SURGEON STRL SZ8 (GLOVE) ×1 IMPLANT
GLOVE BIOGEL PI IND STRL 7.0 (GLOVE) ×1 IMPLANT
GLOVE BIOGEL PI IND STRL 8 (GLOVE) ×3 IMPLANT
GLOVE SURG ENC MOIS LTX SZ6.5 (GLOVE) ×1 IMPLANT
GOWN STRL REUS W/ TWL LRG LVL3 (GOWN DISPOSABLE) ×2 IMPLANT
GOWN STRL REUS W/ TWL XL LVL3 (GOWN DISPOSABLE) ×2 IMPLANT
GOWN STRL REUS W/TWL LRG LVL3 (GOWN DISPOSABLE) ×2
GOWN STRL REUS W/TWL XL LVL3 (GOWN DISPOSABLE) ×2
INSERT FOGARTY 61MM (MISCELLANEOUS) IMPLANT
INSERT FOGARTY SM (MISCELLANEOUS) IMPLANT
KIT BASIN OR (CUSTOM PROCEDURE TRAY) ×1 IMPLANT
KIT TURNOVER KIT B (KITS) ×1 IMPLANT
MIX DBX 10CC 35% BONE (Bone Implant) IMPLANT
NDL HYPO 25GX1X1/2 BEV (NEEDLE) ×1 IMPLANT
NDL SPNL 18GX3.5 QUINCKE PK (NEEDLE) ×1 IMPLANT
NEEDLE HYPO 25GX1X1/2 BEV (NEEDLE) ×1 IMPLANT
NEEDLE SPNL 18GX3.5 QUINCKE PK (NEEDLE) ×1 IMPLANT
NS IRRIG 1000ML POUR BTL (IV SOLUTION) ×1 IMPLANT
PACK LAMINECTOMY ORTHO (CUSTOM PROCEDURE TRAY) ×1 IMPLANT
PACK UNIVERSAL I (CUSTOM PROCEDURE TRAY) ×1 IMPLANT
PAD ARMBOARD 7.5X6 YLW CONV (MISCELLANEOUS) ×4 IMPLANT
PATTIES SURGICAL .5 X.5 (GAUZE/BANDAGES/DRESSINGS) IMPLANT
PATTIES SURGICAL .5 X1 (DISPOSABLE) ×1 IMPLANT
PUTTY BONE DBX 5CC MIX (Putty) IMPLANT
SPACER ALIF EXP MAG 26X34 8D (Spacer) IMPLANT
SPACER ALIF EXP MAG 29X39X9 8D (Spacer) IMPLANT
SPONGE INTESTINAL PEANUT (DISPOSABLE) ×4 IMPLANT
SPONGE SURGIFOAM ABS GEL 100 (HEMOSTASIS) ×2 IMPLANT
SPONGE T-LAP 18X18 ~~LOC~~+RFID (SPONGE) ×1 IMPLANT
SUT MNCRL AB 4-0 PS2 18 (SUTURE) ×1 IMPLANT
SUT PDS AB 1 CTX 36 (SUTURE) ×2 IMPLANT
SUT PROLENE 4 0 RB 1 (SUTURE)
SUT PROLENE 4-0 RB1 .5 CRCL 36 (SUTURE) IMPLANT
SUT PROLENE 5 0 C 1 24 (SUTURE) IMPLANT
SUT PROLENE 5 0 CC1 (SUTURE) IMPLANT
SUT PROLENE 6 0 C 1 30 (SUTURE) IMPLANT
SUT PROLENE 6 0 CC (SUTURE) IMPLANT
SUT SILK 0 TIES 10X30 (SUTURE) IMPLANT
SUT SILK 2 0 TIES 10X30 (SUTURE) ×2 IMPLANT
SUT SILK 2 0SH CR/8 30 (SUTURE) IMPLANT
SUT SILK 3 0 TIES 10X30 (SUTURE) ×1 IMPLANT
SUT SILK 3 0 TIES 17X18 (SUTURE)
SUT SILK 3 0SH CR/8 30 (SUTURE) IMPLANT
SUT SILK 3-0 18XBRD TIE BLK (SUTURE) IMPLANT
SUT VIC AB 1 CT1 27 (SUTURE) ×2
SUT VIC AB 1 CT1 27XBRD ANBCTR (SUTURE) ×2 IMPLANT
SUT VIC AB 1 CTX 36 (SUTURE) ×2
SUT VIC AB 1 CTX36XBRD ANBCTR (SUTURE) ×2 IMPLANT
SUT VIC AB 2-0 CT2 18 VCP726D (SUTURE) ×1 IMPLANT
SUT VIC AB 3-0 SH 8-18 (SUTURE) IMPLANT
SYR BULB IRRIG 60ML STRL (SYRINGE) ×1 IMPLANT
TAPE CLOTH SURG 4X10 WHT LF (GAUZE/BANDAGES/DRESSINGS) IMPLANT
TOWEL GREEN STERILE (TOWEL DISPOSABLE) ×2 IMPLANT
TOWEL GREEN STERILE FF (TOWEL DISPOSABLE) ×1 IMPLANT
TRAY FOLEY W/BAG SLVR 16FR (SET/KITS/TRAYS/PACK) ×1
TRAY FOLEY W/BAG SLVR 16FR ST (SET/KITS/TRAYS/PACK) ×1 IMPLANT
WATER STERILE IRR 1000ML POUR (IV SOLUTION) ×1 IMPLANT
YANKAUER SUCT BULB TIP NO VENT (SUCTIONS) ×1 IMPLANT

## 2022-10-29 NOTE — Op Note (Signed)
Date: October 29, 2022  Preoperative diagnosis: Lumbar radicular pain with spinal stenosis and degenerative scoliosis  Postoperative diagnosis: Same  Procedure: Anterior spine exposure at the L4-L5 and L5-S1 disc space via anterior retroperitoneal approach for L4-L5 and L5-S1 ALIF  Surgeon: Dr. Cephus Shelling, MD  Co-surgeon: Dr. Estill Bamberg, MD  Indications: 66 year old female with bilateral lower extremity radicular pain with evidence of spinal stenosis and degenerative scoliosis.  Vascular surgery was asked to assist with anterior spine exposure at L4-L5 and L5-S1 disc space for ALIF.  Patient presents today after risks benefits discussed.  Findings: The L4-L5 and L5-S1 disc space were marked over the left rectus muscle with a fluoroscopic C arm.  A paramedian incision was made here and opened the anterior rectus sheath and mobilized the left rectus muscle to the midline.  The peritoneum was then dissected off the posterior rectus sheath above arcuate line and this was opened up to the level of L4-L5.  I then mobilized peritoneum and left ureter across midline.  Initially went to the L5-S1 disc space and I ligated the middle sacral vessels between clips and these were divided.  I then fully mobilized the left iliac vein to get the L5-S1 disc space fully exposed from the front.  I then went up one level and mobilized the left iliac artery and vein toward midline including ligating one iliolumbar branch off the left iliac vein to get the L4-L5 disc base exposed from the front.  Anesthesia: General  Details: Patient was taken to the operating room after informed consent was obtained.  Placed on operative table in supine position.  General endotracheal anesthesia was induced.  Fluoroscopic C-arm was then used in lateral position to mark the L4-L5 and L5-S1 disc space over the left rectus muscle.  The abdominal wall was then prepped and draped in standard sterile fashion.  Antibiotics were  given and timeout performed.  Initially made a paramedian incision over the L4-L5 and L5-S1 disc space with scalpel.  Dissected down Bovie cautery through subcutaneous tissue and used cerebral retractors for added visualization.  The anterior rectus sheath was then opened longitudinally over the two levels of the disc space.  I then mobilized the left rectus muscle to the midline.  I then used Kd and then mobilized the peritoneum off the posterior rectus sheath above arcuate line and then opened the posterior rectus sheath with Metzenbaum scissors up to the L4-L5 disc space.  The peritoneum and left ureter were then fully mobilized out of the retroperitoneum across the midline and we could visualize the left psoas and left iliac vessels.  I then had Dr. Yevette Edwards use hand-held Wiley retractors to pull the peritoneum and left ureter across midline.  I went to the L5-S1 disc space first and divided the middle sacral vessels between clips and then fully mobilized the left iliac vein to get the L5-S1 disc space exposed from the front.  I then went up one level and then mobilized the left iliac artery toward the midline and identified the left iliac vein.  One iliolumbar branch was divided between 2-0 silk ties clips and divided.  I then fully mobilized the left iliac and artery and vein toward the midline until we fully exposed the L4-L5 disc space from the front.  Initially I then went back and placed a fixed NuVasive retractor on the field.  I used 120 reverse lips on each side of the disc space as well as cranial caudal and the spinal disc  was exposed at L5-S1.  We confirmed with a spinal needle in the disc space on lateral fluoroscopy and we were at the correct level.  Case was turned over to Dr. Katherina Right.  Please see his dictation for completion at L5-S1.  I was called back into the room once this was complete and we then used hand-held Wiley retractors to pull the left iliac artery and vein toward the midline and  then move the 120 reverse lip retractors on the fixed Nuvasive retractor up to the L4-L5 disc space.  Case was turned over to Dr. Katherina Right.  Complication: None  Condition: Stable  Marty Heck, MD [facility] Office: [place]

## 2022-10-29 NOTE — OR Nursing (Signed)
Dr. Margo Aye radiologist called with no unexpected metal or sponge in belly

## 2022-10-29 NOTE — H&P (Signed)
History and Physical Interval Note:  10/29/2022 7:40 AM  Whitney Gregory  has presented today for surgery, with the diagnosis of Ongoing right greater than left leg pain, with an MRI notable for multilevel stenosis, and x-rays notable for a prominent degenerative lumbar curvature.  The various methods of treatment have been discussed with the patient and family. After consideration of risks, benefits and other options for treatment, the patient has consented to  Procedure(s): ANTERIOR LUMBAR INTERBODY FUSION LUMBAR 4- LUMBAR 5, LUMBAR 5 - SACRUM 1 WITH INSTRUMENTATION AND ALLOGRAFT (N/A) ABDOMINAL EXPOSURE (N/A) as a surgical intervention.  The patient's history has been reviewed, patient examined, no change in status, stable for surgery.  I have reviewed the patient's chart and labs.  Questions were answered to the patient's satisfaction.    Anterior spine exposure for L4-L5 and L5-S1 ALIF  Cephus Shelling        Patient name: Whitney Gregory        MRN: [id number]        DOB: Jan 04, 1956          Sex: female   REASON FOR CONSULT: Evaluate for abdominal exposure for L4-S1 ALIF   HPI: Whitney Gregory is a 67 y.o. female, with chronic lower back pain that presents for evaluation of abdominal exposure for L4-S1 ALIF.  Patient describes right leg radicular leg pain as well as some left leg radicular leg pain.  She has failed conservative management.  She has been evaluated Dr. Yevette Edwards who has recommended an L4-L5 and L5-S1 ALIF with vascular surgery to assist from an anterior approach for the initial stage.  There as a second stage with a lateral interbody fusion at L2-L3 and L3-L4 and a posterior fusion at L1-L2.  She states her only previous abdominal surgery is a partial hysterectomy as well as a laparoscopy for endometriosis.  No abdominal wall mesh.         Past Medical History:  Diagnosis Date   Anemia      hx of    Anxiety     Arthritis     Bilateral leg pain     Cervicalgia       cervical surgery 1999 & 2000   Depression     Fibromyalgia     GERD (gastroesophageal reflux disease)      occasional            Past Surgical History:  Procedure Laterality Date   ABDOMINAL HYSTERECTOMY       DILATION AND CURETTAGE OF UTERUS       NECK SURGERY       OTHER SURGICAL HISTORY        lapararoscopies for endometriosis    PARTIAL KNEE ARTHROPLASTY   04/27/2012    Procedure: UNICOMPARTMENTAL KNEE;  Surgeon: Shelda Pal, MD;  Location: [facility];  Service: Orthopedics;  Laterality: Left;   TONSILLECTOMY               Family History  Problem Relation Age of Onset   Anesthesia problems Mother     Hypertension Father        SOCIAL HISTORY: Social History         Socioeconomic History   Marital status: Divorced      Spouse name: Not on file   Number of children: Not on file   Years of education: Not on file   Highest education level: Not on file  Occupational History   Not on file  Tobacco  Use   Smoking status: Never   Smokeless tobacco: Never  Substance and Sexual Activity   Alcohol use: Yes      Alcohol/week: 2.0 standard drinks of alcohol      Types: 2 Glasses of wine per week   Drug use: No   Sexual activity: Not on file  Other Topics Concern   Not on file  Social History Narrative   Not on file    Social Determinants of Health    Financial Resource Strain: Not on file  Food Insecurity: Not on file  Transportation Needs: Not on file  Physical Activity: Not on file  Stress: Not on file  Social Connections: Not on file  Intimate Partner Violence: Not on file           Allergies  Allergen Reactions   Prednisone Other (See Comments)      Makes pt feel like "top of head is going to blow off", small doses seem to be tolerable, but pt prefers not to take             Current Outpatient Medications  Medication Sig Dispense Refill   amphetamine-dextroamphetamine (ADDERALL XR) 30 MG 24 hr capsule Take 30 mg by mouth every morning.        amphetamine-dextroamphetamine (ADDERALL) 30 MG tablet Take 30 mg by mouth daily.       buPROPion (WELLBUTRIN XL) 300 MG 24 hr tablet Take 300 mg by mouth every morning.       celecoxib (CELEBREX) 200 MG capsule Take 1 capsule (200 mg total) by mouth 2 (two) times daily. (Patient taking differently: Take 200 mg by mouth daily.)       promethazine (PHENERGAN) 25 MG tablet Take 25 mg by mouth every 6 (six) hours as needed for nausea.    0   sertraline (ZOLOFT) 100 MG tablet Take 200 mg by mouth daily.   4   temazepam (RESTORIL) 30 MG capsule Take 30 mg by mouth at bedtime.       valsartan-hydrochlorothiazide (DIOVAN-HCT) 160-12.5 MG tablet Take 1 tablet by mouth at bedtime.        No current facility-administered medications for this visit.      REVIEW OF SYSTEMS:  [X]  denotes positive finding, [ ]  denotes negative finding Cardiac   Comments:  Chest pain or chest pressure:      Shortness of breath upon exertion:      Short of breath when lying flat:      Irregular heart rhythm:             Vascular      Pain in calf, thigh, or hip brought on by ambulation:      Pain in feet at night that wakes you up from your sleep:       Blood clot in your veins:      Leg swelling:              Pulmonary      Oxygen at home:      Productive cough:       Wheezing:              Neurologic      Sudden weakness in arms or legs:       Sudden numbness in arms or legs:       Sudden onset of difficulty speaking or slurred speech:      Temporary loss of vision in one eye:       Problems with  dizziness:              Gastrointestinal      Blood in stool:       Vomited blood:              Genitourinary      Burning when urinating:       Blood in urine:             Psychiatric      Major depression:              Hematologic      Bleeding problems:      Problems with blood clotting too easily:             Skin      Rashes or ulcers:             Constitutional      Fever or chills:           PHYSICAL EXAM:    Vitals:    10/21/22 1530  BP: (!) 155/86  Pulse: 80  Resp: 14  Temp: 98.2 F (36.8 C)  TempSrc: Temporal  SpO2: 95%  Weight: 117 lb (53.1 kg)  Height: 5\' 1"  (1.549 m)      GENERAL: The patient is a well-nourished female, in no acute distress. The vital signs are documented above. CARDIAC: There is a regular rate and rhythm.  VASCULAR:  Palpable femoral pulses bilaterally Palpable DP pulses bilaterally PULMONARY: No respiratory distress. ABDOMEN: Soft and non-tender.  MUSCULOSKELETAL: There are no major deformities or cyanosis. NEUROLOGIC: No focal weakness or paresthesias are detected. PSYCHIATRIC: The patient has a normal affect.   DATA:    MRI reviewed from 08/31/21:      Assessment/Plan:   66 year old female with chronic lower back pain with lower extremity radiculopathy that presents for preop evaluation of abdominal exposure for L4-L5 and L5-S1 ALIF.  I have reviewed her MRI imaging and discussed I think she would be a good candidate for anterior approach.  Discussed paramedian incision over the left rectus muscle and then mobilizing the left rectus to enter the retroperitoneum and mobilizing peritoneum and left ureter across midline.  Discussed moving the iliac artery and vein and risk of injury to the above structures.  All questions answered.  Look forward to assisting Dr. 79 next week.     Yevette Edwards, MD [facility] Office: [phone]

## 2022-10-29 NOTE — Transfer of Care (Signed)
Immediate Anesthesia Transfer of Care Note  Patient: Whitney Gregory  Procedure(s) Performed: ANTERIOR LUMBAR INTERBODY FUSION LUMBAR 4- LUMBAR 5, LUMBAR 5 - SACRUM 1 WITH INSTRUMENTATION AND ALLOGRAFT ABDOMINAL EXPOSURE  Patient Location: PACU  Anesthesia Type:General  Level of Consciousness: awake, alert , oriented, patient cooperative, and responds to stimulation  Airway & Oxygen Therapy: Patient Spontanous Breathing and Patient connected to nasal cannula oxygen  Post-op Assessment: Report given to RN, Post -op Vital signs reviewed and stable, and Patient moving all extremities X 4  Post vital signs: Reviewed and stable  Last Vitals:  Vitals Value Taken Time  BP 123/68 10/29/22 1156  Temp    Pulse 93 10/29/22 1157  Resp 14 10/29/22 1157  SpO2 95 % 10/29/22 1157  Vitals shown include unvalidated device data.  Last Pain:  Vitals:   10/29/22 0609  TempSrc:   PainSc: 4          Complications: No notable events documented.

## 2022-10-29 NOTE — Progress Notes (Signed)
Received patient to the unit. Oriented patient to the unit and room. Made comfortable. Assessed and addressed pain. Placed call bell with in reach. No other needs are noted.  

## 2022-10-29 NOTE — Anesthesia Procedure Notes (Signed)
Procedure Name: Intubation Date/Time: 10/29/2022 8:34 AM  Performed by: Shary Decamp, CRNAPre-anesthesia Checklist: Patient identified, Patient being monitored, Timeout performed, Emergency Drugs available and Suction available Patient Re-evaluated:Patient Re-evaluated prior to induction Oxygen Delivery Method: Circle System Utilized Preoxygenation: Pre-oxygenation with 100% oxygen Induction Type: IV induction Ventilation: Mask ventilation without difficulty Laryngoscope Size: Miller and 2 Grade View: Grade I Tube type: Oral Tube size: 7.0 mm Number of attempts: 1 Airway Equipment and Method: Stylet Placement Confirmation: ETT inserted through vocal cords under direct vision, positive ETCO2 and breath sounds checked- equal and bilateral Secured at: 21 cm Tube secured with: Tape Dental Injury: Teeth and Oropharynx as per pre-operative assessment

## 2022-10-29 NOTE — Anesthesia Preprocedure Evaluation (Addendum)
Anesthesia Evaluation  Patient identified by MRN, date of birth, ID band Patient awake    Reviewed: Allergy & Precautions, NPO status , Patient's Chart, lab work & pertinent test results  Airway Mallampati: I  TM Distance: >3 FB Neck ROM: Full    Dental no notable dental hx.    Pulmonary neg pulmonary ROS   Pulmonary exam normal breath sounds clear to auscultation       Cardiovascular hypertension (151/81 in preop, normally 120-130 SBP), Pt. on medications Normal cardiovascular exam Rhythm:Regular Rate:Normal     Neuro/Psych  PSYCHIATRIC DISORDERS Anxiety Depression       GI/Hepatic Neg liver ROS,GERD  Controlled,,  Endo/Other  negative endocrine ROS    Renal/GU negative Renal ROS  negative genitourinary   Musculoskeletal  (+) Arthritis ,  Fibromyalgia -  Abdominal   Peds  Hematology negative hematology ROS (+)   Anesthesia Other Findings Hx chronic oxy use, stopped in 2018   Reproductive/Obstetrics negative OB ROS                             Anesthesia Physical Anesthesia Plan  ASA: 2  Anesthesia Plan: General   Post-op Pain Management: Tylenol PO (pre-op)* and Ketamine IV*   Induction: Intravenous  PONV Risk Score and Plan: 4 or greater and Ondansetron, Dexamethasone, Midazolam and Treatment may vary due to age or medical condition  Airway Management Planned: Oral ETT and Video Laryngoscope Planned  Additional Equipment: None  Intra-op Plan:   Post-operative Plan: Extubation in OR  Informed Consent: I have reviewed the patients History and Physical, chart, labs and discussed the procedure including the risks, benefits and alternatives for the proposed anesthesia with the patient or authorized representative who has indicated his/her understanding and acceptance.     Dental advisory given  Plan Discussed with: CRNA  Anesthesia Plan Comments:        Anesthesia  Quick Evaluation

## 2022-10-29 NOTE — H&P (Signed)
PREOPERATIVE H&P  Chief Complaint: Bilateral leg pain  HPI: Whitney Gregory is a 66 y.o. female who presents with ongoing pain in the bilateral legs  Imaging studies reveal multilevel stenosis and a degenerative scolisos spanning L2-S1   Patient has failed multiple forms of conservative care and continues to have pain (see office notes for additional details regarding the patient's full course of treatment)  Past Medical History:  Diagnosis Date   ADHD (attention deficit hyperactivity disorder)    Anemia    hx of    Anxiety    Arthritis    Bilateral leg pain    Cervicalgia    cervical surgery 1999 & 2000   Depression    Family history of adverse reaction to anesthesia    PONV   Fibromyalgia    GERD (gastroesophageal reflux disease)    pt denies   Hypertension    Past Surgical History:  Procedure Laterality Date   DILATION AND CURETTAGE OF UTERUS     KNEE SURGERY Right    torn meniscus repaired   NECK SURGERY     OTHER SURGICAL HISTORY     lapararoscopies for endometriosis    PARTIAL KNEE ARTHROPLASTY  04/27/2012   Procedure: UNICOMPARTMENTAL KNEE;  Surgeon: Shelda Pal, MD;  Location: [facility];  Service: Orthopedics;  Laterality: Left;   TONSILLECTOMY     VAGINAL HYSTERECTOMY     still has ovaries   Social History   Socioeconomic History   Marital status: Divorced    Spouse name: Not on file   Number of children: 2   Years of education: Not on file   Highest education level: Not on file  Occupational History   Not on file  Tobacco Use   Smoking status: Never   Smokeless tobacco: Never  Vaping Use   Vaping Use: Never used  Substance and Sexual Activity   Alcohol use: Not Currently    Alcohol/week: 7.0 standard drinks of alcohol    Types: 7 Standard drinks or equivalent per week   Drug use: Yes    Types: Other-see comments    Comment: Delta-8 (vapes at night)   Sexual activity: Not on file  Other Topics Concern   Not on file  Social History  Narrative   1 biological, 1 adopted   Social Determinants of Health   Financial Resource Strain: Not on file  Food Insecurity: Not on file  Transportation Needs: Not on file  Physical Activity: Not on file  Stress: Not on file  Social Connections: Not on file   Family History  Problem Relation Age of Onset   Anesthesia problems Mother    Hypertension Father    Allergies  Allergen Reactions   Prednisone Other (See Comments)    Makes pt feel like "top of head is going to blow off", small doses seem to be tolerable, but pt prefers not to take    Prior to Admission medications   Medication Sig Start Date End Date Taking? Authorizing Provider  amphetamine-dextroamphetamine (ADDERALL XR) 30 MG 24 hr capsule Take 30 mg by mouth every morning. 07/23/22  Yes [provider]  amphetamine-dextroamphetamine (ADDERALL) 30 MG tablet Take 30 mg by mouth daily. 10/03/22  Yes [provider]  buPROPion (WELLBUTRIN XL) 300 MG 24 hr tablet Take 300 mg by mouth every morning.   Yes [provider]  celecoxib (CELEBREX) 200 MG capsule Take 1 capsule (200 mg total) by mouth 2 (two) times daily. Patient taking  differently: Take 200 mg by mouth daily. 04/28/12  Yes Babish, Rodman Key, PA-C  promethazine (PHENERGAN) 25 MG tablet Take 25 mg by mouth every 6 (six) hours as needed for nausea.  05/29/15  Yes [provider]  sertraline (ZOLOFT) 100 MG tablet Take 200 mg by mouth daily. 05/17/15  Yes [provider]  temazepam (RESTORIL) 30 MG capsule Take 30 mg by mouth at bedtime.   Yes [provider]  valsartan-hydrochlorothiazide (DIOVAN-HCT) 160-12.5 MG tablet Take 1 tablet by mouth at bedtime. 09/05/22  Yes [provider]     All other systems have been reviewed and were otherwise negative with the exception of those mentioned in the HPI and as above.  Physical Exam: Vitals:   10/29/22 0552  BP: (!) 151/81  Pulse: 87  Resp: 16  Temp: 98.4 F  (36.9 C)  SpO2: 100%    Body mass index is 22.3 kg/m.  General: Alert, no acute distress Cardiovascular: No pedal edema Respiratory: No cyanosis, no use of accessory musculature Skin: No lesions in the area of chief complaint Neurologic: Sensation intact distally Psychiatric: Patient is competent for consent with normal mood and affect Lymphatic: No axillary or cervical lymphadenopathy   Assessment/Plan: Ongoing right greater than left leg pain, due to multilevel stenosis, and a prominent degenerative lumbar curvature Plan for Procedure(s): ANTERIOR LUMBAR INTERBODY FUSION LUMBAR 4- LUMBAR 5, LUMBAR 5 - SACRUM 1 WITH INSTRUMENTATION AND ALLOGRAFT ABDOMINAL EXPOSURE   [name], MD 10/29/2022 6:52 AM

## 2022-10-29 NOTE — Op Note (Signed)
PATIENT NAME: Whitney Gregory   MEDICAL RECORD NO.:   [id number]    DATE OF BIRTH: [birth year]   DATE OF PROCEDURE: 10/29/2022                               OPERATIVE REPORT    PREOPERATIVE DIAGNOSES: 1. Bilateral lumbar radiculopathy (M54.17) 2. Spinal stenosis spanning L2-S1 3. Lumbar degenerative scoliosis    POSTOPERATIVE DIAGNOSES: 1. Bilateral lumbar radiculopathy (M54.17) 2. Spinal stenosis spanning L2-S1 3. Lumbar degenerative scoliosis    PROCEDURE: 1. Anterior lumbar interbody fusion, L4-5, L5-S1 2. Insertion of interbody device x2 (Globus expandable spacers). 3. Intraoperative use of fluoroscopy. 4. Use of morselized allograft - DBX-mix 5. Co-surgeon with Dr. Clotilde Dieter for retroperitoneal exposure   SURGEON:  Estill Bamberg, MD   ASSISTANT:  Jason Coop, PA-C.   ANESTHESIA:  General endotracheal anesthesia.   COMPLICATIONS:  None.   DISPOSITION:  Stable.   ESTIMATED BLOOD LOSS:  100 cc   INDICATIONS FOR SURGERY:  Briefly, Whitney Gregory is a very pleasant 66 year old female, who has been having progressive debilitating pain in the bilateral legs and low back. Her imaging studies were as noted above. The patient did fail appropriate nonoperative measures, but did continue to have significant pain. Given her ongoing pain and dysfunction, we did discuss proceeding with the procedure noted above.  The patient was fully aware of the risks and limitations associated with surgery, and did wish to proceed.  The plan was to proceed with stage I of her procedure today, and to return tomorrow for stage II, specifically, a lateral fusion at L2-3 and L3-4, in addition to a posterior decompression and fusion spanning L2-S1.     OPERATIVE DETAILS:  On 10/29/2022, the patient was brought to surgery and general endotracheal anesthesia was administered.  The patient was placed supine on the hospital bed.  The patient's abdomen was prepped and draped in the usual sterile  fashion.  An anterior retroperitoneal approach was then performed by Dr. Clotilde Dieter.  I did function as his first assistant during the approach.  Once the anterior lumbar spine was noted, we did focus our attention on the L5-S1 intervertebral space.  I then performed a thorough and complete L5-S1 intervertebral diskectomy to the level of the posterior longitudinal ligament.  I was very pleased with the diskectomy that I was able to accomplish.  The endplates were then appropriately prepared and the appropriate sized anterior intervertebral spacer (Medium, 8 degree) was packed with DBX-mix and tamped into position.  The implant was then expanded to approximately 9.5 mm in height.  I was very pleased with the press-fit of the implant.  Dr. Chestine Spore then scrubbed back into the case, and repositioned the retractors over the L4/5 intervertebral space.    I then performed a thorough and complete L4/5 intervertebral diskectomy to the level of the posterior longitudinal ligament.  I was very pleased with the diskectomy that I was able to accomplish.  The endplates were then appropriately prepared and the appropriate sized anterior intervertebral spacer (large, 8 degree) was packed with DBX-mix and tamped into position, and was expanded to approximately 10.7 mm in height. I was very pleased with the press-fit of the implant, and the excellent restoration of height.   I did liberally use AP and lateral fluoroscopy to ensure that the implants were in the appropriate position, and was very pleased with the radiographs.  The wound was  copiously irrigated.  The fascia was  closed using #1 PDS.  The subcutaneous layer was closed using 0 Vicryl followed by 2-0 Vicryl, and the skin was closed using 4-0 Monocryl. Benzoin and Steri-Strips were applied followed by sterile dressing.   All instrument counts were correct at the termination of the procedure.    Of note, Jason Coop was my assistant throughout  surgery, and did aid in retraction, suctioning, and closure for the entire procedure.     Estill Bamberg, MD

## 2022-10-29 NOTE — Anesthesia Postprocedure Evaluation (Signed)
Anesthesia Post Note  Patient: Whitney Gregory  Procedure(s) Performed: ANTERIOR LUMBAR INTERBODY FUSION LUMBAR 4- LUMBAR 5, LUMBAR 5 - SACRUM 1 WITH INSTRUMENTATION AND ALLOGRAFT ABDOMINAL EXPOSURE     Patient location during evaluation: PACU Anesthesia Type: General Level of consciousness: awake and alert, oriented and patient cooperative Pain management: pain level controlled Vital Signs Assessment: post-procedure vital signs reviewed and stable Respiratory status: spontaneous breathing, nonlabored ventilation and respiratory function stable Cardiovascular status: blood pressure returned to baseline and stable Postop Assessment: no apparent nausea or vomiting Anesthetic complications: no   No notable events documented.  Last Vitals:  Vitals:   10/29/22 0552 10/29/22 1155  BP: (!) 151/81   Pulse: 87   Resp: 16   Temp: 36.9 C 37.3 C  SpO2: 100%     Last Pain:  Vitals:   10/29/22 1155  TempSrc:   PainSc: Asleep                 Lannie Fields

## 2022-10-30 ENCOUNTER — Inpatient Hospital Stay (HOSPITAL_COMMUNITY): Payer: Medicare Other | Admitting: Anesthesiology

## 2022-10-30 ENCOUNTER — Inpatient Hospital Stay (HOSPITAL_COMMUNITY): Payer: Medicare Other

## 2022-10-30 ENCOUNTER — Encounter (HOSPITAL_COMMUNITY): Payer: Self-pay | Admitting: Orthopedic Surgery

## 2022-10-30 ENCOUNTER — Encounter (HOSPITAL_COMMUNITY)
Admission: RE | Disposition: A | Discharge status: Home / Self Care | Payer: Self-pay | Source: Home / Self Care | Attending: Orthopedic Surgery

## 2022-10-30 ENCOUNTER — Inpatient Hospital Stay (HOSPITAL_COMMUNITY): Admission: RE | Admit: 2022-10-30 | Payer: Medicare Other | Source: Home / Self Care | Admitting: Orthopedic Surgery

## 2022-10-30 DIAGNOSIS — M4807 Spinal stenosis, lumbosacral region: Secondary | ICD-10-CM

## 2022-10-30 DIAGNOSIS — M4186 Other forms of scoliosis, lumbar region: Secondary | ICD-10-CM

## 2022-10-30 DIAGNOSIS — M5417 Radiculopathy, lumbosacral region: Secondary | ICD-10-CM

## 2022-10-30 HISTORY — PX: ANTERIOR LAT LUMBAR FUSION: SHX1168

## 2022-10-30 HISTORY — PX: POSTERIOR LUMBAR FUSION 4 LEVEL: SHX6037

## 2022-10-30 SURGERY — ANTERIOR LATERAL LUMBAR FUSION 2 LEVELS
Anesthesia: General | Laterality: Right

## 2022-10-30 MED ORDER — ONDANSETRON HCL 4 MG/2ML IJ SOLN
INTRAMUSCULAR | Status: AC
  Filled 2022-10-30: qty 2

## 2022-10-30 MED ORDER — ACETAMINOPHEN 10 MG/ML IV SOLN
INTRAVENOUS | Status: AC
  Filled 2022-10-30: qty 100

## 2022-10-30 MED ORDER — GLYCOPYRROLATE 0.2 MG/ML IJ SOLN
INTRAMUSCULAR | Status: DC | PRN
  Administered 2022-10-30: .8 mg via INTRAVENOUS

## 2022-10-30 MED ORDER — ACETAMINOPHEN 10 MG/ML IV SOLN
INTRAVENOUS | Status: DC | PRN
  Administered 2022-10-30: 1000 mg via INTRAVENOUS

## 2022-10-30 MED ORDER — ARTIFICIAL TEARS OPHTHALMIC OINT
TOPICAL_OINTMENT | OPHTHALMIC | Status: DC | PRN
  Administered 2022-10-30: 1 via OPHTHALMIC

## 2022-10-30 MED ORDER — HYDROMORPHONE HCL 1 MG/ML IJ SOLN
INTRAMUSCULAR | Status: AC
  Filled 2022-10-30: qty 0.5

## 2022-10-30 MED ORDER — SUCCINYLCHOLINE CHLORIDE 200 MG/10ML IV SOSY
PREFILLED_SYRINGE | INTRAVENOUS | Status: DC | PRN
  Administered 2022-10-30: 100 mg via INTRAVENOUS

## 2022-10-30 MED ORDER — ONDANSETRON HCL 4 MG/2ML IJ SOLN
INTRAMUSCULAR | Status: DC | PRN
  Administered 2022-10-30: 4 mg via INTRAVENOUS

## 2022-10-30 MED ORDER — ONDANSETRON HCL 4 MG/2ML IJ SOLN: 4.0000 mg | Freq: Once | INTRAMUSCULAR | Status: DC | PRN

## 2022-10-30 MED ORDER — HYDROMORPHONE HCL 1 MG/ML IJ SOLN
INTRAMUSCULAR | Status: DC | PRN
  Administered 2022-10-30 (×2): .5 mg via INTRAVENOUS

## 2022-10-30 MED ORDER — FENTANYL CITRATE (PF) 250 MCG/5ML IJ SOLN
INTRAMUSCULAR | Status: DC | PRN
  Administered 2022-10-30 (×5): 50 ug via INTRAVENOUS

## 2022-10-30 MED ORDER — BUPIVACAINE-EPINEPHRINE (PF) 0.25% -1:200000 IJ SOLN
INTRAMUSCULAR | Status: DC | PRN
  Administered 2022-10-30: 30 mL

## 2022-10-30 MED ORDER — CEFAZOLIN SODIUM 1 G IJ SOLR
INTRAMUSCULAR | Status: AC
  Filled 2022-10-30: qty 20

## 2022-10-30 MED ORDER — PROPOFOL 500 MG/50ML IV EMUL
INTRAVENOUS | Status: DC | PRN
  Administered 2022-10-30: 100 ug/kg/min via INTRAVENOUS

## 2022-10-30 MED ORDER — OXYCODONE HCL 5 MG PO TABS
5.0000 mg | ORAL_TABLET | Freq: Once | ORAL | Status: AC | PRN
  Administered 2022-10-30: 5 mg via ORAL

## 2022-10-30 MED ORDER — BUPIVACAINE-EPINEPHRINE (PF) 0.25% -1:200000 IJ SOLN
INTRAMUSCULAR | Status: AC
  Filled 2022-10-30: qty 30

## 2022-10-30 MED ORDER — BUPIVACAINE-EPINEPHRINE 0.25% -1:200000 IJ SOLN
INTRAMUSCULAR | Status: DC | PRN
  Administered 2022-10-30 (×2): 8 mL

## 2022-10-30 MED ORDER — LACTATED RINGERS IV SOLN: INTRAVENOUS | Status: DC | PRN

## 2022-10-30 MED ORDER — HYDROMORPHONE HCL 1 MG/ML IJ SOLN
INTRAMUSCULAR | Status: AC
  Filled 2022-10-30: qty 1

## 2022-10-30 MED ORDER — ROCURONIUM BROMIDE 10 MG/ML (PF) SYRINGE
PREFILLED_SYRINGE | INTRAVENOUS | Status: DC | PRN
  Administered 2022-10-30: 100 mg via INTRAVENOUS

## 2022-10-30 MED ORDER — FENTANYL CITRATE (PF) 250 MCG/5ML IJ SOLN
INTRAMUSCULAR | Status: AC
  Filled 2022-10-30: qty 5

## 2022-10-30 MED ORDER — THROMBIN 20000 UNITS EX SOLR
CUTANEOUS | Status: AC
  Filled 2022-10-30: qty 20000

## 2022-10-30 MED ORDER — OXYCODONE-ACETAMINOPHEN 5-325 MG PO TABS: 1.0000 | ORAL_TABLET | ORAL | 0 refills | Status: DC | PRN

## 2022-10-30 MED ORDER — LIDOCAINE 2% (20 MG/ML) 5 ML SYRINGE
INTRAMUSCULAR | Status: DC | PRN
  Administered 2022-10-30: 100 mg via INTRAVENOUS

## 2022-10-30 MED ORDER — KETAMINE HCL 50 MG/5ML IJ SOSY
PREFILLED_SYRINGE | INTRAMUSCULAR | Status: AC
  Filled 2022-10-30: qty 5

## 2022-10-30 MED ORDER — KETAMINE HCL 10 MG/ML IJ SOLN
INTRAMUSCULAR | Status: DC | PRN
  Administered 2022-10-30: 40 mg via INTRAVENOUS
  Administered 2022-10-30: 10 mg via INTRAVENOUS

## 2022-10-30 MED ORDER — ARTIFICIAL TEARS OPHTHALMIC OINT
TOPICAL_OINTMENT | OPHTHALMIC | Status: AC
  Filled 2022-10-30: qty 3.5

## 2022-10-30 MED ORDER — PROPOFOL 10 MG/ML IV BOLUS
INTRAVENOUS | Status: DC | PRN
  Administered 2022-10-30: 120 mg via INTRAVENOUS
  Administered 2022-10-30: 50 mg via INTRAVENOUS

## 2022-10-30 MED ORDER — PHENYLEPHRINE 80 MCG/ML (10ML) SYRINGE FOR IV PUSH (FOR BLOOD PRESSURE SUPPORT)
PREFILLED_SYRINGE | INTRAVENOUS | Status: DC | PRN
  Administered 2022-10-30: 80 ug via INTRAVENOUS
  Administered 2022-10-30: 160 ug via INTRAVENOUS
  Administered 2022-10-30: 80 ug via INTRAVENOUS
  Administered 2022-10-30 (×2): 160 ug via INTRAVENOUS
  Administered 2022-10-30: 80 ug via INTRAVENOUS
  Administered 2022-10-30: 160 ug via INTRAVENOUS

## 2022-10-30 MED ORDER — NEOSTIGMINE METHYLSULFATE 3 MG/3ML IV SOSY
PREFILLED_SYRINGE | INTRAVENOUS | Status: DC | PRN
  Administered 2022-10-30: 5 mg via INTRAVENOUS

## 2022-10-30 MED ORDER — SUGAMMADEX SODIUM 200 MG/2ML IV SOLN
INTRAVENOUS | Status: DC | PRN
  Administered 2022-10-30: 100 mg via INTRAVENOUS

## 2022-10-30 MED ORDER — BUPIVACAINE LIPOSOME 1.3 % IJ SUSP
INTRAMUSCULAR | Status: AC
  Filled 2022-10-30: qty 20

## 2022-10-30 MED ORDER — OXYCODONE HCL 5 MG/5ML PO SOLN: 5.0000 mg | Freq: Once | ORAL | Status: AC | PRN

## 2022-10-30 MED ORDER — CEFAZOLIN SODIUM-DEXTROSE 2-4 GM/100ML-% IV SOLN
INTRAVENOUS | Status: AC
  Filled 2022-10-30: qty 100

## 2022-10-30 MED ORDER — ROCURONIUM BROMIDE 10 MG/ML (PF) SYRINGE
PREFILLED_SYRINGE | INTRAVENOUS | Status: AC
  Filled 2022-10-30: qty 10

## 2022-10-30 MED ORDER — HYDROMORPHONE HCL 1 MG/ML IJ SOLN
0.2500 mg | INTRAMUSCULAR | Status: DC | PRN
  Administered 2022-10-30 (×4): 0.5 mg via INTRAVENOUS

## 2022-10-30 MED ORDER — MIDAZOLAM HCL 2 MG/2ML IJ SOLN
INTRAMUSCULAR | Status: DC | PRN
  Administered 2022-10-30: 2 mg via INTRAVENOUS

## 2022-10-30 MED ORDER — NEOSTIGMINE METHYLSULFATE 3 MG/3ML IV SOSY
PREFILLED_SYRINGE | INTRAVENOUS | Status: AC
  Filled 2022-10-30: qty 6

## 2022-10-30 MED ORDER — MIDAZOLAM HCL 2 MG/2ML IJ SOLN
INTRAMUSCULAR | Status: AC
  Filled 2022-10-30: qty 2

## 2022-10-30 MED ORDER — GLYCOPYRROLATE PF 0.2 MG/ML IJ SOSY
PREFILLED_SYRINGE | INTRAMUSCULAR | Status: AC
  Filled 2022-10-30: qty 5

## 2022-10-30 MED ORDER — SUCCINYLCHOLINE CHLORIDE 200 MG/10ML IV SOSY
PREFILLED_SYRINGE | INTRAVENOUS | Status: AC
  Filled 2022-10-30: qty 10

## 2022-10-30 MED ORDER — OXYCODONE HCL 5 MG PO TABS
ORAL_TABLET | ORAL | Status: AC
  Filled 2022-10-30: qty 1

## 2022-10-30 MED ORDER — 0.9 % SODIUM CHLORIDE (POUR BTL) OPTIME
TOPICAL | Status: DC | PRN
  Administered 2022-10-30: 1000 mL

## 2022-10-30 MED ORDER — METHOCARBAMOL 500 MG PO TABS: 500.0000 mg | ORAL_TABLET | Freq: Four times a day (QID) | ORAL | 2 refills | Status: DC | PRN

## 2022-10-30 MED ORDER — PHENYLEPHRINE HCL-NACL 20-0.9 MG/250ML-% IV SOLN
INTRAVENOUS | Status: DC | PRN
  Administered 2022-10-30: 30 ug/min via INTRAVENOUS

## 2022-10-30 MED ORDER — CEFAZOLIN SODIUM-DEXTROSE 2-4 GM/100ML-% IV SOLN
2.0000 g | INTRAVENOUS | Status: AC
  Administered 2022-10-30 (×2): 2 g via INTRAVENOUS

## 2022-10-30 MED ORDER — LIDOCAINE 2% (20 MG/ML) 5 ML SYRINGE
INTRAMUSCULAR | Status: AC
  Filled 2022-10-30: qty 5

## 2022-10-30 MED ORDER — THROMBIN 20000 UNITS EX SOLR
CUTANEOUS | Status: DC | PRN
  Administered 2022-10-30: 20 mL

## 2022-10-30 SURGICAL SUPPLY — 113 items
AGENT HMST KT MTR STRL THRMB (HEMOSTASIS)
APL SKNCLS STERI-STRIP NONHPOA (GAUZE/BANDAGES/DRESSINGS) ×4
BAG COUNTER SPONGE SURGICOUNT (BAG) ×4 IMPLANT
BAG SPNG CNTER NS LX DISP (BAG) ×2
BENZOIN TINCTURE PRP APPL 2/3 (GAUZE/BANDAGES/DRESSINGS) IMPLANT
BLADE CLIPPER SURG (BLADE) IMPLANT
BLADE EXTENDER LTP N26 DISP (ORTHOPEDIC DISPOSABLE SUPPLIES) IMPLANT
BLADE SURG 10 STRL SS (BLADE) ×2 IMPLANT
BUR PRESCISION 1.7 ELITE (BURR) ×2 IMPLANT
BUR ROUND FLUTED 5 RND (BURR) IMPLANT
BUR ROUND PRECISION 4.0 (BURR) IMPLANT
BUR SABER RD CUTTING 3.0 (BURR) IMPLANT
CAP PUSHER PTP (ORTHOPEDIC DISPOSABLE SUPPLIES) IMPLANT
CLIP SPRING STIM LLIF SAFEOP (CLIP) IMPLANT
CNTNR URN SCR LID CUP LEK RST (MISCELLANEOUS) ×2 IMPLANT
CONT SPEC 4OZ STRL OR WHT (MISCELLANEOUS)
CORD BIPOLAR FORCEPS 12FT (ELECTRODE) ×2 IMPLANT
COVER BACK TABLE 80X110 HD (DRAPES) ×2 IMPLANT
COVER SURGICAL LIGHT HANDLE (MISCELLANEOUS) ×4 IMPLANT
DILATOR INSULATED SAFEOP OVAL (NEUROSURGERY SUPPLIES) IMPLANT
DRAIN CHANNEL 15F RND FF W/TCR (WOUND CARE) ×2 IMPLANT
DRAPE C-ARM 42X72 X-RAY (DRAPES) ×4 IMPLANT
DRAPE ORTHO SPLIT 77X108 STRL (DRAPES) ×2
DRAPE POUCH INSTRU U-SHP 10X18 (DRAPES) ×4 IMPLANT
DRAPE SURG 17X23 STRL (DRAPES) ×16 IMPLANT
DRAPE SURG ORHT 6 SPLT 77X108 (DRAPES) ×2 IMPLANT
DRSG MEPILEX BORDER 4X12 (GAUZE/BANDAGES/DRESSINGS) IMPLANT
DRSG MEPILEX BORDER 4X8 (GAUZE/BANDAGES/DRESSINGS) IMPLANT
DURAPREP 26ML APPLICATOR (WOUND CARE) ×4 IMPLANT
ELECT BLADE 4.0 EZ CLEAN MEGAD (MISCELLANEOUS) ×2
ELECT BLADE 6.5 EXT (BLADE) ×2 IMPLANT
ELECT CAUTERY BLADE 6.4 (BLADE) ×6 IMPLANT
ELECT KIT SAFEOP SSEP/SURF (KITS) ×2
ELECT REM PT RETURN 9FT ADLT (ELECTROSURGICAL) ×4
ELECTRODE BLDE 4.0 EZ CLN MEGD (MISCELLANEOUS) ×2 IMPLANT
ELECTRODE KT SAFEOP SSEP/SURF (KITS) IMPLANT
ELECTRODE REM PT RTRN 9FT ADLT (ELECTROSURGICAL) ×4 IMPLANT
EVACUATOR SILICONE 100CC (DRAIN) ×2 IMPLANT
GAUZE 4X4 16PLY ~~LOC~~+RFID DBL (SPONGE) ×10 IMPLANT
GAUZE SPONGE 4X4 12PLY STRL (GAUZE/BANDAGES/DRESSINGS) ×2 IMPLANT
GLOVE BIO SURGEON STRL SZ7 (GLOVE) ×6 IMPLANT
GLOVE BIO SURGEON STRL SZ8 (GLOVE) ×4 IMPLANT
GLOVE BIOGEL PI IND STRL 7.0 (GLOVE) ×2 IMPLANT
GLOVE BIOGEL PI IND STRL 8 (GLOVE) ×4 IMPLANT
GLOVE SURG ENC MOIS LTX SZ6.5 (GLOVE) ×6 IMPLANT
GOWN STRL REUS W/ TWL LRG LVL3 (GOWN DISPOSABLE) ×6 IMPLANT
GOWN STRL REUS W/ TWL XL LVL3 (GOWN DISPOSABLE) ×6 IMPLANT
GOWN STRL REUS W/TWL LRG LVL3 (GOWN DISPOSABLE) ×6
GOWN STRL REUS W/TWL XL LVL3 (GOWN DISPOSABLE) ×6
GUIDEWIRE LLIF TT 320 (WIRE) IMPLANT
GUIDEWIRE SHARP VIPER II (WIRE) IMPLANT
IV CATH 14GX2 1/4 (CATHETERS) ×2 IMPLANT
KIT ALARA NEURO ACCESS (KITS) IMPLANT
KIT BASIN OR (CUSTOM PROCEDURE TRAY) ×4 IMPLANT
KIT POSITION SURG JACKSON T1 (MISCELLANEOUS) ×2 IMPLANT
KIT TURNOVER KIT B (KITS) ×4 IMPLANT
KNIFE ANNULOTOMY (BLADE) IMPLANT
LIF ILLUMINATION SYSTEM STERIL (SYSTAGENIX WOUND MANAGEMENT) ×2
MARKER SKIN DUAL TIP RULER LAB (MISCELLANEOUS) ×4 IMPLANT
MIX DBX 10CC 35% BONE (Bone Implant) IMPLANT
NDL HYPO 25GX1X1/2 BEV (NEEDLE) ×4 IMPLANT
NDL SPNL 18GX3.5 QUINCKE PK (NEEDLE) ×6 IMPLANT
NEEDLE HYPO 25GX1X1/2 BEV (NEEDLE) ×2 IMPLANT
NEEDLE SPNL 18GX3.5 QUINCKE PK (NEEDLE) ×4 IMPLANT
NS IRRIG 1000ML POUR BTL (IV SOLUTION) ×4 IMPLANT
PACK LAMINECTOMY ORTHO (CUSTOM PROCEDURE TRAY) ×4 IMPLANT
PACK UNIVERSAL I (CUSTOM PROCEDURE TRAY) ×4 IMPLANT
PAD ARMBOARD 7.5X6 YLW CONV (MISCELLANEOUS) ×8 IMPLANT
PATTIES SURGICAL .5 X1 (DISPOSABLE) ×2 IMPLANT
PATTIES SURGICAL .5 X3 (DISPOSABLE) ×2 IMPLANT
PATTIES SURGICAL .5X1.5 (GAUZE/BANDAGES/DRESSINGS) ×2 IMPLANT
PATTIES SURGICAL .75X.75 (GAUZE/BANDAGES/DRESSINGS) ×2 IMPLANT
PROBE BALL TIP LLIF SAFEOP (NEUROSURGERY SUPPLIES) IMPLANT
ROD PRE LORD VIPER 5.5X140 (Rod) IMPLANT
ROD VIPER 130MM (Rod) IMPLANT
SCREW POLY VIPER2 7X40MM (Screw) IMPLANT
SCREW SET SINGLE INNER MIS (Screw) IMPLANT
SCREW XTAB POLY VIPER  6X45 (Screw) ×4 IMPLANT
SCREW XTAB POLY VIPER  7.5X40 (Screw) ×2 IMPLANT
SCREW XTAB POLY VIPER  7X45 (Screw) ×10 IMPLANT
SCREW XTAB POLY VIPER 6X45 (Screw) IMPLANT
SCREW XTAB POLY VIPER 7.5X40 (Screw) IMPLANT
SCREW XTAB POLY VIPER 7X45 (Screw) IMPLANT
SHIM INTRADISCAL LTP W DISP (ORTHOPEDIC DISPOSABLE SUPPLIES) IMPLANT
SPACER TRANSCEND 10X18X50 0D (Spacer) IMPLANT
SPACER TRANSCEND 8X18X55 0D (Spacer) IMPLANT
SPONGE INTESTINAL PEANUT (DISPOSABLE) ×6 IMPLANT
SPONGE SURGIFOAM ABS GEL 100 (HEMOSTASIS) IMPLANT
SPONGE T-LAP 4X18 ~~LOC~~+RFID (SPONGE) ×2 IMPLANT
STRIP CLOSURE SKIN 1/2X4 (GAUZE/BANDAGES/DRESSINGS) IMPLANT
SURGIFLO W/THROMBIN 8M KIT (HEMOSTASIS) IMPLANT
SUT MNCRL AB 4-0 PS2 18 (SUTURE) ×6 IMPLANT
SUT PROLENE 6 0 C 1 24 (SUTURE) IMPLANT
SUT VIC AB 0 CT1 18XCR BRD 8 (SUTURE) ×6 IMPLANT
SUT VIC AB 0 CT1 8-18 (SUTURE) ×4
SUT VIC AB 1 CT1 18XCR BRD 8 (SUTURE) ×4 IMPLANT
SUT VIC AB 1 CT1 8-18 (SUTURE) ×4
SUT VIC AB 2-0 CT2 18 VCP726D (SUTURE) ×6 IMPLANT
SYR 20ML LL LF (SYRINGE) ×2 IMPLANT
SYR BULB IRRIG 60ML STRL (SYRINGE) ×4 IMPLANT
SYR CONTROL 10ML LL (SYRINGE) ×4 IMPLANT
SYR TB 1ML LUER SLIP (SYRINGE) ×2 IMPLANT
SYSTEM ILLUMINATION LIF STERIL (SYSTAGENIX WOUND MANAGEMENT) IMPLANT
TAP CANN VIPER2 DL 5.0 (TAP) IMPLANT
TAP CANN VIPER2 DL 6.0 (TAP) IMPLANT
TAP CANN VIPER2 DL 7.0 (TAP) IMPLANT
TAP VIPER MIS 4.35MM (TAP) IMPLANT
TAPE CLOTH SURG 4X10 WHT LF (GAUZE/BANDAGES/DRESSINGS) IMPLANT
TOWEL GREEN STERILE (TOWEL DISPOSABLE) ×4 IMPLANT
TOWEL GREEN STERILE FF (TOWEL DISPOSABLE) ×4 IMPLANT
TRAY FOLEY MTR SLVR 16FR STAT (SET/KITS/TRAYS/PACK) ×4 IMPLANT
WATER STERILE IRR 1000ML POUR (IV SOLUTION) ×4 IMPLANT
YANKAUER SUCT BULB TIP NO VENT (SUCTIONS) ×4 IMPLANT

## 2022-10-30 NOTE — Anesthesia Procedure Notes (Signed)
Procedure Name: Intubation Date/Time: 10/30/2022 7:56 AM  Performed by: Cy Blamer, CRNAPre-anesthesia Checklist: Patient identified, Emergency Drugs available, Suction available and Patient being monitored Patient Re-evaluated:Patient Re-evaluated prior to induction Oxygen Delivery Method: Circle system utilized Preoxygenation: Pre-oxygenation with 100% oxygen Induction Type: IV induction Ventilation: Mask ventilation without difficulty Laryngoscope Size: Miller and 2 Grade View: Grade I Tube type: Oral Tube size: 6.5 mm Number of attempts: 1 Airway Equipment and Method: Oral airway and Bite block Placement Confirmation: ETT inserted through vocal cords under direct vision, positive ETCO2 and breath sounds checked- equal and bilateral Secured at: 21 cm Tube secured with: Tape Dental Injury: Teeth and Oropharynx as per pre-operative assessment

## 2022-10-30 NOTE — Op Note (Signed)
PATIENT NAME: Whitney Gregory   MEDICAL RECORD NO.:   [id number]    DATE OF BIRTH: 23-Jun-1956   DATE OF PROCEDURE: 10/30/2022                               OPERATIVE REPORT   PREOPERATIVE DIAGNOSES: 1. Bilateral lumbar radiculopathy (M54.17) 2. Spinal stenosis spanning L2-S1 3. Lumbar degenerative scoliosis  4. Status post anterior lumbar interbody fusion on 10/29/2022   POSTOPERATIVE DIAGNOSES: 1. Bilateral lumbar radiculopathy (M54.17) 2. Spinal stenosis spanning L2-S1 3. Lumbar degenerative scoliosis  4. Status post anterior lumbar interbody fusion on 10/29/2022   PROCEDURE:  1.  Right-sided lateral interbody fusion, L2/3, L3/4  via direct lateral retroperitoneal approach. 2.  Insertion of interbody device x2 (Alphatec intervertebral spacer). 3.  Posterior spinal fusion, L2-3, L3-4, L4-5, L5-S1 4.  Placement of posterior instrumentation bilaterally at L2, L3, L4, L5, and S1 5.  L4-5 decompression, including bilateral partial facetectomy and lateral recess decompression 6.  Intraoperative use of fluoroscopy. 7.  Use of morselized allograft - DBX mix   SURGEON:  Estill Bamberg, MD   ASSISTANTJason Coop PA-C.   ANESTHESIA:  General endotracheal anesthesia.   COMPLICATIONS:  None.   DISPOSITION:  Stable.   ESTIMATED BLOOD LOSS:  100 cc   INDICATIONS:  Briefly, Whitney Gregory is a very pleasant 66 year old female who is status post an anterior lumbar fusion at L4-5 and L5-S1.  This was performed on 10/29/2022.  Please refer to my operative report dated 10/29/2022, for a more detailed account of the patient's history and indications for surgery.  She did tolerate the procedure well.  She did present today for stage 2 of her procedure.  Specifically, a right-sided lateral fusion at L2-3 and L3-4, in combination with a posterior fusion and decompression spanning L2-S1. The patient did wish to proceed, after a full understanding of the risks and benefits of surgery.    DESCRIPTION OF PROCEDURE:  On 10/30/2022, the patient was brought to surgery and general endotracheal anesthesia was administered.  The patient was placed in the lateral decubitus position, with the right side up.  Neurologic monitoring leads were placed by the monitoring technician.  The patient's torso and lower extremities were secured to the bed.  The patient's hips and knees were flexed in order to lessen the tension on the psoas musculature.  The right flank was then prepped and draped in the usual sterile fashion.  The bed was flexed, in order to optimize exposure to the L2/3 and L3/4 intervertebral spaces.  After a timeout procedure was performed, a right-sided transverse incision was made over the right flank overlying the L2/3 and L3/4 intervertebral spaces.  The retroperitoneal space was encountered, after dissection through the oblique musculature.  The peritoneum was bluntly swept anteriorly, and the psoas was readily identified.  I did use a series of dilators to dock over the L3/4 intervertebral space.  I did use neurologic monitoring while placing the  dilators, in order to ensure that there were no neurologic structures in the immediate vicinity of the dilators.  The lumbar plexus was noted to be posterior.  A self-retaining retractor was placed, and was attached to a rigid arm.  The retractor was very gently dilated and a shim was placed into the L3/4 intervertebral space.  I then used a knife to perform an annulotomy at the lateral aspect of the L3/4 intervertebral space.  I then used  a series of curettes and pituitary rongeurs in order to perform a thorough and complete L3/4 intervertebral diskectomy.  The contralateral annulus was released.  I then placed a series of intervertebral spacer trials, and I did feel that a 8 mm x 18 mm x 55 mm spacer would be the most appropriate fit.  The appropriate spacer was then packed with DBX-mix and tamped into position.  I was very pleased with the final  resting position of the intervertebral spacer.  Excellent height restoration was noted on the right side, the side of the preoperative collapse.   At this point, the retractor was repositioned over the L2-3 intervertebral space.  Once again, liberal neurologic monitoring was utilized in order to ensure that there were no neurologic structures in the immediate vicinity of the retractor.  The lumbar plexus was noted to be posterior and not in the region of the retractor.  As previously described at L3-4, the retractor was very gently dilated and a shim was placed into the L2/3 intervertebral space.  I then used a knife to perform an annulotomy at the lateral aspect of the L3/4 intervertebral space.  I then used a series of curettes and pituitary rongeurs in order to perform a thorough and complete L2/3 intervertebral diskectomy.  The contralateral annulus was released.  I then placed a series of intervertebral spacer trials, and I did feel that a 10 mm x 18 mm x 50 mm spacer would be the most appropriate fit.  The appropriate spacer was then packed with DBX-mix and tamped into position.  I was very pleased with the final resting position of the intervertebral spacer.  Once again, excellent height restoration was noted on the right side, the side of the preoperative collapse. I was very pleased with the final AP and lateral fluoroscopic images. At this point, the wound was copiously irrigated.  The fascia, internal, and external oblique musculature was closed using #1 Vicryl.  The subcutaneous layer was closed using 2-0 Vicryl and the skin was closed using 4-0 Monocryl.  A sterile dressing was then applied.  The drapes were then removed, and the patient was placed prone on a well-padded flat Jackson bed with a spinal frame.  The back was then prepped and draped in the usual sterile fashion, and once again, a timeout procedure was performed.  I then brought in fluoroscopy and marked out the lateral aspect of the  pedicles spanning L2-S1.  Paramedian incisions were then made on the right and left sides, just lateral to the lateral border of the pedicles.  On the left side, the facet joints and posterolateral gutters associated with L2-3, L3-4, L4-5, and L5-S1 were subperiosteally exposed and decorticated in anticipation for a posterior fusion.   As the L4-5 level was the level with the most significant degree of compression, I did elect to proceed with a posterior decompression at this level as well.  I did use a high-speed bur in addition to a series of Kerrison punches to perform a bilateral partial facetectomy, thereby decompressing the right and left lateral recess.  I was pleased with the decompression that I was able to accomplish.  At this point, DBX mix was placed into the facet joints and posterolateral gutters on the left.  I then proceeded with cannulation of the pedicles bilaterally spanning L2-S1.  To accomplish this, using AP and lateral fluoroscopy, Jamshidi's were advanced through the pedicles bilaterally from L2-S1.  Guidewires were placed through the Jamshidi's, and 6 mm cannulated taps  were advanced over the guidewires at each level.  Bilaterally, 7 x 45 mm screws were placed over the guidewires, through the L3, and L4 pedicles.  At L2, 6 x 45 mm screws were placed.  At L5, a 7 x 40 mm screw was placed on the left, and a 7 x 45 mm screw was placed on the right.  At S1, a 7.5 x 40 mm screw was placed on the left, and a 7 x 40 mm screw was placed on the right. The guidewires were then removed.  Fluoroscopy was used liberally throughout this portion of the procedure.  140 mm rods were secured into the tulip heads of the screws bilaterally.  Caps were placed and a final locking procedure was performed.  The wound was copiously irrigated.  I was very pleased with the AP and lateral fluoroscopic images.  Excellent restoration of coronal alignment was noted.  The wound was then closed using #1 Vicryl followed  by 2-0 Vicryl followed by 4-0 Monocryl.  Benzoin and Steri-Strips were applied over the left lateral wound and left posterior wound, followed by sterile dressing.     Of note, I did use neurologic monitoring throughout the entire surgery, and there was no abnormal or sustained EMG activity noted throughout the entire surgery. All instrument counts were correct at the termination of the procedure.    Of note, Pricilla Holm was my assistant throughout surgery, and did aid in retraction, placement of the hardware, suctioning, the decompression, and closure, for both the lateral and posterior portions of the procedure.  Phylliss Bob, MD

## 2022-10-30 NOTE — Progress Notes (Signed)
OT Cancellation Note  Patient Details Name: MANAAL MANDALA MRN: [id number] DOB: October 07, 1956   Cancelled Treatment:    Reason Eval/Treat Not Completed: Other (comment) (Orders received, chart reviewed, Pt in surgery at this time.)  Alm Bustard, OTR/L 10/30/2022, 10:40 AM

## 2022-10-30 NOTE — H&P (Signed)
Patient tolerated her procedure well yesterday. Will proceed today for stage 2, a lateral and posterior fusion and decompression.

## 2022-10-30 NOTE — Progress Notes (Signed)
Patient evaluated in the recovery room.  She is noted to have expected abdominal pain and low back pain.  Patient denies leg pain, and is noted to have full dorsiflexion and plantarflexion strength bilaterally.  I did review her preoperative and postoperative imaging with her.  Patient will head to 3C, room 9 for her overnight stay, and will proceed with physical therapy tomorrow, with likely discharge tomorrow.

## 2022-10-30 NOTE — Progress Notes (Signed)
Late entry. Seen on afternoon rounds. Doing well overall. Abdomen soft and appropriately tender. Palpable pulses in bilateral feet. She's understandably sore after extensive spinal surgery. No issues from vascular standpoint. Will follow peripherally.  TNH

## 2022-10-30 NOTE — Anesthesia Postprocedure Evaluation (Signed)
Anesthesia Post Note  Patient: Whitney Gregory  Procedure(s) Performed: RIGHT-SIDED LATERAL INTERBODY FUSION LUMBAR 2- LUMBAR 3, LUMBAR 3- LUMBAR 4 WITH INSTRUMENTATION AND ALLOGRAFT (Right) POSTERIOR DECOMPRESSION FUSION LUMBAR 2- LUMBAR 3, LUMBAR 3- LUMBAR 4, LUMBAR 4- LUMBAR 5, LUMBAR 5, SACRUM 1 WITH INSTRUMENTATION AND ALLOGRAFT     Patient location during evaluation: PACU Anesthesia Type: General Level of consciousness: awake and alert Pain management: pain level controlled Vital Signs Assessment: post-procedure vital signs reviewed and stable Respiratory status: spontaneous breathing, nonlabored ventilation, respiratory function stable and patient connected to nasal cannula oxygen Cardiovascular status: blood pressure returned to baseline and stable Postop Assessment: no apparent nausea or vomiting Anesthetic complications: no  No notable events documented.  Last Vitals:  Vitals:   10/30/22 1430 10/30/22 1445  BP: 133/68 (!) 122/56  Pulse: 84 71  Resp: 19 12  Temp:    SpO2: 92% 97%    Last Pain:  Vitals:   10/30/22 1415  TempSrc:   PainSc: 10-Worst pain ever                 Morad Tal S

## 2022-10-30 NOTE — Anesthesia Preprocedure Evaluation (Signed)
Anesthesia Evaluation  Patient identified by MRN, date of birth, ID band Patient awake    Reviewed: Allergy & Precautions, H&P , NPO status , Patient's Chart, lab work & pertinent test results  Airway Mallampati: II  TM Distance: >3 FB Neck ROM: Full    Dental no notable dental hx.    Pulmonary neg pulmonary ROS   Pulmonary exam normal breath sounds clear to auscultation       Cardiovascular hypertension, Normal cardiovascular exam Rhythm:Regular Rate:Normal     Neuro/Psych negative neurological ROS  negative psych ROS   GI/Hepatic Neg liver ROS,GERD  ,,  Endo/Other  negative endocrine ROS    Renal/GU negative Renal ROS  negative genitourinary   Musculoskeletal negative musculoskeletal ROS (+)    Abdominal   Peds negative pediatric ROS (+)  Hematology negative hematology ROS (+)   Anesthesia Other Findings   Reproductive/Obstetrics negative OB ROS                             Anesthesia Physical Anesthesia Plan  ASA: 2  Anesthesia Plan: General   Post-op Pain Management:    Induction: Intravenous  PONV Risk Score and Plan: 3 and Ondansetron, Dexamethasone and Treatment may vary due to age or medical condition  Airway Management Planned: Oral ETT  Additional Equipment:   Intra-op Plan:   Post-operative Plan: Extubation in OR  Informed Consent: I have reviewed the patients History and Physical, chart, labs and discussed the procedure including the risks, benefits and alternatives for the proposed anesthesia with the patient or authorized representative who has indicated his/her understanding and acceptance.     Dental advisory given  Plan Discussed with: CRNA and Surgeon  Anesthesia Plan Comments:        Anesthesia Quick Evaluation

## 2022-10-30 NOTE — Transfer of Care (Signed)
Immediate Anesthesia Transfer of Care Note  Patient: Whitney Gregory  Procedure(s) Performed: RIGHT-SIDED LATERAL INTERBODY FUSION LUMBAR 2- LUMBAR 3, LUMBAR 3- LUMBAR 4 WITH INSTRUMENTATION AND ALLOGRAFT (Right) POSTERIOR DECOMPRESSION FUSION LUMBAR 2- LUMBAR 3, LUMBAR 3- LUMBAR 4, LUMBAR 4- LUMBAR 5, LUMBAR 5, SACRUM 1 WITH INSTRUMENTATION AND ALLOGRAFT  Patient Location: PACU  Anesthesia Type:General  Level of Consciousness: awake, alert , and oriented  Airway & Oxygen Therapy: Patient Spontanous Breathing and Patient connected to face mask oxygen  Post-op Assessment: Report given to RN, Post -op Vital signs reviewed and stable, Patient moving all extremities X 4, and Patient able to stick tongue midline  Post vital signs: Reviewed  Last Vitals:  Vitals Value Taken Time  BP 122/51 10/30/22 1350  Temp 99.4   Pulse 75 10/30/22 1351  Resp 7 10/30/22 1351  SpO2 100 % 10/30/22 1351  Vitals shown include unvalidated device data.  Last Pain:  Vitals:   10/30/22 0656  TempSrc: Oral  PainSc:       Patients Stated Pain Goal: 3 (10/30/22 0421)  Complications: No notable events documented.

## 2022-10-31 ENCOUNTER — Other Ambulatory Visit (HOSPITAL_COMMUNITY): Payer: Self-pay

## 2022-10-31 MED ORDER — METHOCARBAMOL 500 MG PO TABS
500.0000 mg | ORAL_TABLET | Freq: Four times a day (QID) | ORAL | 2 refills | Status: DC
  Filled 2022-10-31: qty 30, 4d supply, fill #0

## 2022-10-31 MED ORDER — OXYCODONE-ACETAMINOPHEN 5-325 MG PO TABS
1.0000 | ORAL_TABLET | ORAL | 0 refills | Status: DC | PRN
  Filled 2022-10-31: qty 40, 4d supply, fill #0
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
## 2022-10-31 NOTE — Progress Notes (Signed)
    Patient doing well PO day 1/2 from ANT/LAT/POST staged procedure. Expected increased LBP today. She has been up and walking and medications controlling her pain. She is eating and drinking and NL B/B function.  Physical Exam: BP (!) 143/64 (BP Location: Left Arm)   Pulse 82   Temp 99.7 F (37.6 C) (Oral)   Resp 18   Ht 5\' 1"  (1.549 m)   Wt 53.1 kg   SpO2 92%   BMI 22.11 kg/m    Dressings in place, CDI, pt appears comfortable in bed, TLSO brace at bedside NVI  POD #1/2 s/p ANT/LAT/POST staged procedure  - up with PT/OT, encourage ambulation - Percocet for pain, Robaxin for muscle spasms  Sent to [facility] pharm elec as her home pharm does not have pain medication - likely d/c home today with f/u in 2 weeks

## 2022-10-31 NOTE — Plan of Care (Signed)
Pt doing well. Pt given D/C instructions with verbal understanding. Rx's were sent to the pharmacy by MD. Pt's incision is clean and dry with no sign of infection. Pt's IV was removed prior to D/C. Pt D/C'd home via wheelchair per MD order. Pt is stable @ D/C and has no other needs at this time. Cyntha Brickman, RN  

## 2022-10-31 NOTE — TOC Transition Note (Signed)
Transition of Care [facility]) - CM/SW Discharge Note   Patient Details  Name: Whitney Gregory MRN: [id number] Date of Birth: 05-14-1956  Transition of Care [facility]) CM/SW Contact:  Tom-Johnson, Hershal Coria, RN Phone Number: 10/31/2022, 11:17 AM   Clinical Narrative:     Patient is scheduled for discharge today. No TOC needs or recommendations noted. Denies any needs. Family to transport at discharge. No further TOC needs noted.     Final next level of care: Home/Self Care Barriers to Discharge: Barriers Resolved   Patient Goals and CMS Choice Patient states their goals for this hospitalization and ongoing recovery are:: To return home CMS Medicare.gov Compare Post Acute Care list provided to:: Patient Choice offered to / list presented to : NA  Discharge Placement                Patient to be transferred to facility by: Family      Discharge Plan and Services                DME Arranged: N/A DME Agency: NA       HH Arranged: NA HH Agency: NA        Social Determinants of Health (SDOH) Interventions     Readmission Risk Interventions     No data to display

## 2022-10-31 NOTE — Evaluation (Signed)
Physical Therapy Evaluation Patient Details Name: Whitney Gregory MRN: [id number] DOB: Dec 31, 1955 Today's Date: 10/31/2022  History of Present Illness  Pt is a 66 y.o. female s/p two stage procedure: anterior interbody fusion L 4-S1 with instrumentation and allograft on 10/29/2022; s/p R sided lateral interbody fusion L2-4 with instrumentation and allograft and posterior decompression fusion L2-S1 with instrumentation and allograft on 10/30/2022. PMH significant for ADHD< arthritis, depression, fibromyalgia, HTN, R meniscus repair, neck surgery, and L partial knee arthroplasty.  Clinical Impression   Pt presents with post-operative pain and min weakness so utilized a RW post-op, otherwise pt reports being close to baseline. Pt to benefit from acute PT to address deficits. Pt ambulated hallway distance and is overall supervision for safety only, which will be provided by her sister at d/c. All education on back precautions completed, all questions answered, pt appropriate to d/c home from PT perspective.       Recommendations for follow up therapy are one component of a multi-disciplinary discharge planning process, led by the attending physician.  Recommendations may be updated based on patient status, additional functional criteria and insurance authorization.  Follow Up Recommendations No PT follow up      Assistance Recommended at Discharge PRN  Patient can return home with the following       Equipment Recommendations None recommended by PT (pt to borrow a RW from a friend)  Recommendations for Other Services       Functional Status Assessment Patient has not had a recent decline in their functional status     Precautions / Restrictions Precautions Precautions: Back Precaution Booklet Issued: Yes (comment) Precaution Comments: reviewed BLT rules Required Braces or Orthoses: Spinal Brace Spinal Brace: Thoracolumbosacral orthotic;Applied in standing position Restrictions Weight  Bearing Restrictions: No      Mobility  Bed Mobility Overal bed mobility: Needs Assistance Bed Mobility: Rolling, Sidelying to Sit, Sit to Sidelying Rolling: Supervision Sidelying to sit: Supervision     Sit to sidelying: Supervision General bed mobility comments: performs log roll technique without cuing    Transfers Overall transfer level: Needs assistance Equipment used: Rolling walker (2 wheels) Transfers: Sit to/from Stand Sit to Stand: Supervision           General transfer comment: supervision for safety    Ambulation/Gait Ambulation/Gait assistance: Supervision Gait Distance (Feet): 250 Feet Assistive device: Rolling walker (2 wheels) Gait Pattern/deviations: Step-through pattern, Decreased stride length, Trunk flexed Gait velocity: decr     General Gait Details: cues for upright posture, slowed but steady with use of RW  Stairs Stairs: Yes Stairs assistance: Supervision Stair Management: One rail Right, Alternating pattern, Forwards Number of Stairs: 6    Wheelchair Mobility    Modified Rankin (Stroke Patients Only)       Balance Overall balance assessment: Mild deficits observed, not formally tested                                           Pertinent Vitals/Pain Pain Assessment Pain Assessment: Faces Faces Pain Scale: Hurts little more Pain Location: back, abdomen from surgical sites Pain Descriptors / Indicators: Discomfort, Operative site guarding, Sore Pain Intervention(s): Limited activity within patient's tolerance, Monitored during session, Repositioned    Home Living Family/patient expects to be discharged to:: Private residence Living Arrangements: Alone;Other (Comment) (Plans to stay with sister following surgery, the following home living situation is reflective of  sister's house) Available Help at Discharge: Family Type of Home: House Home Access: Stairs to enter Entrance Stairs-Rails: Right;Left (cannot  reach both at once) Entrance Stairs-Number of Steps: 5   Home Layout: One level Home Equipment: Crutches;Cane - single point;Rolling Walker (2 wheels);Shower seat - built in Additional Comments: staying at sister's    Prior Function Prior Level of Function : Independent/Modified Independent;Driving;Working/employed             Mobility Comments: no AD ADLs Comments: independent in ADL and IADL. Was working at [employer]   Dominant Hand: Right    Extremity/Trunk Assessment   Upper Extremity Assessment Upper Extremity Assessment: Defer to OT evaluation    Lower Extremity Assessment Lower Extremity Assessment: Generalized weakness    Cervical / Trunk Assessment Cervical / Trunk Assessment: Back Surgery  Communication   Communication: No difficulties  Cognition Arousal/Alertness: Awake/alert Behavior During Therapy: WFL for tasks assessed/performed Overall Cognitive Status: Within Functional Limits for tasks assessed                                          General Comments General comments (skin integrity, edema, etc.): VSS. MAP of 83 at beginning of session    Exercises     Assessment/Plan    PT Assessment Patient does not need any further PT services  PT Problem List         PT Treatment Interventions      PT Goals (Current goals can be found in the Care Plan section)  Acute Rehab PT Goals PT Goal Formulation: With patient Time For Goal Achievement: 10/31/22 Potential to Achieve Goals: Good    Frequency       Co-evaluation               AM-PAC PT "6 Clicks" Mobility  Outcome Measure Help needed turning from your back to your side while in a flat bed without using bedrails?: None Help needed moving from lying on your back to sitting on the side of a flat bed without using bedrails?: None Help needed moving to and from a bed to a chair (including a wheelchair)?: None Help needed standing up from a chair  using your arms (e.g., wheelchair or bedside chair)?: None Help needed to walk in hospital room?: A Little Help needed climbing 3-5 steps with a railing? : A Little 6 Click Score: 22    End of Session Equipment Utilized During Treatment: Back brace Activity Tolerance: Patient tolerated treatment well Patient left: in bed;with call bell/phone within reach Nurse Communication: Mobility status PT Visit Diagnosis: Other abnormalities of gait and mobility (R26.89)    Time: 1657-9038 PT Time Calculation (min) (ACUTE ONLY): 23 min   Charges:   PT Evaluation $PT Eval Low Complexity: 1 Low PT Treatments $Therapeutic Activity: 8-22 mins        Marye Round, PT DPT Acute Rehabilitation Services Pager [phone]  Office [phone]   Whitney Gregory 10/31/2022, 12:04 PM

## 2022-11-05 NOTE — Discharge Summary (Signed)
Patient ID: Whitney Gregory MRN: [id number] DOB/AGE: 67-18-1957 67 y.o.  Admit date: 10/29/2022 Discharge date: 10/31/2022  Admission Diagnoses:  Principal Problem:   Radiculopathy, lumbar region   Discharge Diagnoses:  Same  Past Medical History:  Diagnosis Date   ADHD (attention deficit hyperactivity disorder)    Anemia    hx of    Anxiety    Arthritis    Bilateral leg pain    Cervicalgia    cervical surgery 1999 & 2000   Depression    Family history of adverse reaction to anesthesia    PONV   Fibromyalgia    GERD (gastroesophageal reflux disease)    pt denies   Hypertension     Surgeries: Procedure(s): RIGHT-SIDED LATERAL INTERBODY FUSION LUMBAR 2- LUMBAR 3, LUMBAR 3- LUMBAR 4 WITH INSTRUMENTATION AND ALLOGRAFT POSTERIOR DECOMPRESSION FUSION LUMBAR 2- LUMBAR 3, LUMBAR 3- LUMBAR 4, LUMBAR 4- LUMBAR 5, LUMBAR 5, SACRUM 1 WITH INSTRUMENTATION AND ALLOGRAFT on 10/30/2022   Consultants: Treatment Team:  Cephus Shelling, MD  Discharged Condition: Improved  Hospital Course: Whitney Gregory is an 66 y.o. female who was admitted 10/29/2022 for operative treatment of Radiculopathy, lumbar region. Patient has severe unremitting pain that affects sleep, daily activities, and work/hobbies. After pre-op clearance the patient was taken to the operating room on 10/30/2022 and underwent  Procedure(s): RIGHT-SIDED LATERAL INTERBODY FUSION LUMBAR 2- LUMBAR 3, LUMBAR 3- LUMBAR 4 WITH INSTRUMENTATION AND ALLOGRAFT POSTERIOR DECOMPRESSION FUSION LUMBAR 2- LUMBAR 3, LUMBAR 3- LUMBAR 4, LUMBAR 4- LUMBAR 5, LUMBAR 5, SACRUM 1 WITH INSTRUMENTATION AND ALLOGRAFT.    Patient was given perioperative antibiotics:  Anti-infectives (From admission, onward)    Start     Dose/Rate Route Frequency Ordered Stop   10/30/22 0730  ceFAZolin (ANCEF) IVPB 2g/100 mL premix        2 g 200 mL/hr over 30 Minutes Intravenous On call to O.R. 10/30/22 0724 10/30/22 1200   10/30/22 0723   ceFAZolin (ANCEF) 2-4 GM/100ML-% IVPB       Note to Pharmacy: Cy Blamer: cabinet override      10/30/22 0723 10/30/22 0821   10/29/22 1600  ceFAZolin (ANCEF) IVPB 2g/100 mL premix        2 g 200 mL/hr over 30 Minutes Intravenous Every 8 hours 10/29/22 1407 10/29/22 2327   10/29/22 0600  ceFAZolin (ANCEF) IVPB 2g/100 mL premix        2 g 200 mL/hr over 30 Minutes Intravenous On call to O.R. 10/29/22 0555 10/29/22 0910        Patient was given sequential compression devices, early ambulation to prevent DVT.  Patient benefited maximally from hospital stay and there were no complications.    Recent vital signs: BP 115/71 (BP Location: Left Arm)   Pulse 92   Temp 99.2 F (37.3 C) (Oral)   Resp 20   Ht 5\' 1"  (1.549 m)   Wt 53.1 kg   SpO2 97%   BMI 22.11 kg/m    Discharge Medications:   Allergies as of 10/31/2022       Reactions   Prednisone Other (See Comments)   Makes pt feel like "top of head is going to blow off", small doses seem to be tolerable, but pt prefers not to take         Medication List     TAKE these medications    amphetamine-dextroamphetamine 30 MG 24 hr capsule Commonly known as: ADDERALL XR Take 30 mg by mouth every morning.  amphetamine-dextroamphetamine 30 MG tablet Commonly known as: ADDERALL Take 30 mg by mouth daily.   buPROPion 300 MG 24 hr tablet Commonly known as: WELLBUTRIN XL Take 300 mg by mouth every morning.   methocarbamol 500 MG tablet Commonly known as: ROBAXIN Take 1-2 tablets (500-1,000 mg total) by mouth every 6 (six) hours.   oxyCODONE-acetaminophen 5-325 MG tablet Commonly known as: PERCOCET/ROXICET Take 1-2 tablets by mouth every 4 (four) hours as needed for severe pain or moderate pain.   promethazine 25 MG tablet Commonly known as: PHENERGAN Take 25 mg by mouth every 6 (six) hours as needed for nausea.   sertraline 100 MG tablet Commonly known as: ZOLOFT Take 200 mg by mouth daily.   temazepam 30 MG  capsule Commonly known as: RESTORIL Take 30 mg by mouth at bedtime.   valsartan-hydrochlorothiazide 160-12.5 MG tablet Commonly known as: DIOVAN-HCT Take 1 tablet by mouth at bedtime.        Diagnostic Studies: DG Lumbar Spine 2-3 Views  Result Date: 10/30/2022 CLINICAL DATA:  Elective surgery. EXAM: LUMBAR SPINE - 2-3 VIEW COMPARISON:  Procedural fluoroscopy 10/29/2022 FINDINGS: Two fluoroscopic spot views of the lumbar spine obtained in frontal and lateral projections. Multilevel plate and screw fixation of the lumbar spine. Fluoroscopy time 11 minutes 47 seconds. Dose 277.39 mGy. IMPRESSION: Intraoperative fluoroscopy for lumbar fusion. Electronically Signed   By: Narda Rutherford M.D.   On: 10/30/2022 13:25   DG C-Arm 1-60 Min-No Report  Result Date: 10/30/2022 Fluoroscopy was utilized by the requesting physician.  No radiographic interpretation.   DG C-Arm 1-60 Min-No Report  Result Date: 10/30/2022 Fluoroscopy was utilized by the requesting physician.  No radiographic interpretation.   DG C-Arm 1-60 Min-No Report  Result Date: 10/30/2022 Fluoroscopy was utilized by the requesting physician.  No radiographic interpretation.   DG C-Arm 1-60 Min-No Report  Result Date: 10/30/2022 Fluoroscopy was utilized by the requesting physician.  No radiographic interpretation.   DG C-Arm 1-60 Min-No Report  Result Date: 10/30/2022 Fluoroscopy was utilized by the requesting physician.  No radiographic interpretation.   DG C-Arm 1-60 Min-No Report  Result Date: 10/30/2022 Fluoroscopy was utilized by the requesting physician.  No radiographic interpretation.   DG Lumbar Spine 1 View  Result Date: 10/29/2022 CLINICAL DATA:  [id number] Surgery, elective [id number] EXAM: LUMBAR SPINE - 1 VIEW COMPARISON:  MRI 08/31/2021 FINDINGS: Single intraoperative fluoroscopic image during lumbar fusion. There are intervertebral disc spacers at L4-L5 and L5-S1. IMPRESSION: Single fluoroscopic image  during lumbar fusion. Intervertebral disc spacers at L4-L5 and L5-S1. Electronically Signed   By: Caprice Renshaw M.D.   On: 10/29/2022 11:40   DG OR LOCAL ABDOMEN  Result Date: 10/29/2022 CLINICAL DATA:  66 year old female undergoing lumbar fusion, evaluating for retained surgical instrument. EXAM: OR LOCAL ABDOMEN COMPARISON:  Intraoperative lumbar fluoroscopy today 0844 hours. Lumbar MRI 08/31/2021. FINDINGS: Portable AP view at levin 12 hours. Lumbar segmentation appears to be normal and interbody implants are demonstrated at L4-L5 and L5-S1. Small surgical clips along the left lateral L5 vertebral body. No retained surgical instrument or other foreign body identified. Negative visible bowel gas, abdominal and pelvic visceral contours. IMPRESSION: 1. No retained surgical instrument or unexpected foreign body identified. 2. L4-L5 and L5-S1 interbody implants, surgical clips. This was discussed by telephone with OR #5 personnel on 10/29/2022 at 11:28 . Electronically Signed   By: Odessa Fleming M.D.   On: 10/29/2022 11:29   DG C-Arm 1-60 Min-No Report  Result Date: 10/29/2022 Fluoroscopy was utilized  by the requesting physician.  No radiographic interpretation.   DG C-Arm 1-60 Min-No Report  Result Date: 10/29/2022 Fluoroscopy was utilized by the requesting physician.  No radiographic interpretation.   DG C-Arm 1-60 Min-No Report  Result Date: 10/29/2022 Fluoroscopy was utilized by the requesting physician.  No radiographic interpretation.    Disposition: Discharge disposition: 01-Home or Self Care       Discharge Instructions     Discharge patient   Complete by: As directed    Discharge disposition: 01-Home or Self Care   Discharge patient date: 10/31/2022      POD #1/2 s/p ANT/LAT/POST staged procedure   - up with PT/OT, encourage ambulation - Percocet for pain, Robaxin for muscle spasms             Sent to [facility] pharm elec as her home pharm does not have pain medication -D/C  instructions sheet printed and in chart -D/C today  -F/U in office 2 weeks   Signed: Eilene Ghazi Sussan Meter 11/05/2022, 1:54 PM

## 2022-11-07 ENCOUNTER — Encounter (HOSPITAL_COMMUNITY): Payer: Self-pay | Admitting: Orthopedic Surgery

## 2022-11-07 MED FILL — Heparin Sodium (Porcine) Inj 1000 Unit/ML: INTRAMUSCULAR | Qty: 30 | Status: AC

## 2022-11-07 MED FILL — Electrolyte-R (PH 7.4) Solution: INTRAVENOUS | Qty: 2000 | Status: AC

## 2022-11-07 MED FILL — Sodium Chloride IV Soln 0.9%: INTRAVENOUS | Qty: 2000 | Status: AC

## 2022-11-12 ENCOUNTER — Other Ambulatory Visit: Payer: Self-pay

## 2022-11-12 DIAGNOSIS — M4186 Other forms of scoliosis, lumbar region: Secondary | ICD-10-CM | POA: Diagnosis not present

## 2022-12-12 DIAGNOSIS — M4186 Other forms of scoliosis, lumbar region: Secondary | ICD-10-CM | POA: Diagnosis not present

## 2022-12-16 ENCOUNTER — Other Ambulatory Visit (HOSPITAL_COMMUNITY): Payer: Self-pay

## 2023-01-23 DIAGNOSIS — M5416 Radiculopathy, lumbar region: Secondary | ICD-10-CM | POA: Diagnosis not present

## 2023-01-29 DIAGNOSIS — R531 Weakness: Secondary | ICD-10-CM | POA: Diagnosis not present

## 2023-01-29 DIAGNOSIS — M4326 Fusion of spine, lumbar region: Secondary | ICD-10-CM | POA: Diagnosis not present

## 2023-02-09 DIAGNOSIS — R531 Weakness: Secondary | ICD-10-CM | POA: Diagnosis not present

## 2023-02-09 DIAGNOSIS — M4326 Fusion of spine, lumbar region: Secondary | ICD-10-CM | POA: Diagnosis not present

## 2023-02-11 DIAGNOSIS — M4326 Fusion of spine, lumbar region: Secondary | ICD-10-CM | POA: Diagnosis not present

## 2023-02-11 DIAGNOSIS — R531 Weakness: Secondary | ICD-10-CM | POA: Diagnosis not present

## 2023-02-16 DIAGNOSIS — R531 Weakness: Secondary | ICD-10-CM | POA: Diagnosis not present

## 2023-02-16 DIAGNOSIS — M4326 Fusion of spine, lumbar region: Secondary | ICD-10-CM | POA: Diagnosis not present

## 2023-02-18 DIAGNOSIS — M4326 Fusion of spine, lumbar region: Secondary | ICD-10-CM | POA: Diagnosis not present

## 2023-02-18 DIAGNOSIS — R531 Weakness: Secondary | ICD-10-CM | POA: Diagnosis not present

## 2023-02-25 DIAGNOSIS — R531 Weakness: Secondary | ICD-10-CM | POA: Diagnosis not present

## 2023-02-25 DIAGNOSIS — M4326 Fusion of spine, lumbar region: Secondary | ICD-10-CM | POA: Diagnosis not present

## 2023-04-16 DIAGNOSIS — R7303 Prediabetes: Secondary | ICD-10-CM | POA: Diagnosis not present

## 2023-04-16 DIAGNOSIS — M5136 Other intervertebral disc degeneration, lumbar region: Secondary | ICD-10-CM | POA: Diagnosis not present

## 2023-04-16 DIAGNOSIS — M8588 Other specified disorders of bone density and structure, other site: Secondary | ICD-10-CM | POA: Diagnosis not present

## 2023-04-16 DIAGNOSIS — Z1159 Encounter for screening for other viral diseases: Secondary | ICD-10-CM | POA: Diagnosis not present

## 2023-04-16 DIAGNOSIS — I10 Essential (primary) hypertension: Secondary | ICD-10-CM | POA: Diagnosis not present

## 2023-04-16 DIAGNOSIS — Z0000 Encounter for general adult medical examination without abnormal findings: Secondary | ICD-10-CM | POA: Diagnosis not present

## 2023-04-16 DIAGNOSIS — G5603 Carpal tunnel syndrome, bilateral upper limbs: Secondary | ICD-10-CM | POA: Diagnosis not present

## 2023-04-16 DIAGNOSIS — F5101 Primary insomnia: Secondary | ICD-10-CM | POA: Diagnosis not present

## 2023-04-16 DIAGNOSIS — E559 Vitamin D deficiency, unspecified: Secondary | ICD-10-CM | POA: Diagnosis not present

## 2023-04-16 DIAGNOSIS — Z23 Encounter for immunization: Secondary | ICD-10-CM | POA: Diagnosis not present

## 2023-04-16 DIAGNOSIS — M797 Fibromyalgia: Secondary | ICD-10-CM | POA: Diagnosis not present

## 2023-05-11 DIAGNOSIS — K4090 Unilateral inguinal hernia, without obstruction or gangrene, not specified as recurrent: Secondary | ICD-10-CM | POA: Diagnosis not present

## 2023-06-29 DIAGNOSIS — R438 Other disturbances of smell and taste: Secondary | ICD-10-CM | POA: Diagnosis not present

## 2023-06-29 DIAGNOSIS — M797 Fibromyalgia: Secondary | ICD-10-CM | POA: Diagnosis not present

## 2023-06-29 DIAGNOSIS — R432 Parageusia: Secondary | ICD-10-CM | POA: Diagnosis not present

## 2023-07-10 DIAGNOSIS — M546 Pain in thoracic spine: Secondary | ICD-10-CM | POA: Diagnosis not present

## 2023-07-13 ENCOUNTER — Other Ambulatory Visit: Payer: Self-pay | Admitting: Orthopedic Surgery

## 2023-07-13 DIAGNOSIS — M545 Low back pain, unspecified: Secondary | ICD-10-CM

## 2023-08-19 DIAGNOSIS — M542 Cervicalgia: Secondary | ICD-10-CM | POA: Diagnosis not present

## 2023-08-19 DIAGNOSIS — Z23 Encounter for immunization: Secondary | ICD-10-CM | POA: Diagnosis not present

## 2023-09-14 DIAGNOSIS — Z1231 Encounter for screening mammogram for malignant neoplasm of breast: Secondary | ICD-10-CM | POA: Diagnosis not present

## 2023-09-14 DIAGNOSIS — M8588 Other specified disorders of bone density and structure, other site: Secondary | ICD-10-CM | POA: Diagnosis not present

## 2023-09-14 DIAGNOSIS — Z8262 Family history of osteoporosis: Secondary | ICD-10-CM | POA: Diagnosis not present

## 2023-10-19 DIAGNOSIS — M5030 Other cervical disc degeneration, unspecified cervical region: Secondary | ICD-10-CM | POA: Diagnosis not present

## 2023-10-19 DIAGNOSIS — Z23 Encounter for immunization: Secondary | ICD-10-CM | POA: Diagnosis not present

## 2023-10-19 DIAGNOSIS — R202 Paresthesia of skin: Secondary | ICD-10-CM | POA: Diagnosis not present

## 2023-10-19 DIAGNOSIS — I10 Essential (primary) hypertension: Secondary | ICD-10-CM | POA: Diagnosis not present

## 2023-10-19 DIAGNOSIS — M5136 Other intervertebral disc degeneration, lumbar region with discogenic back pain only: Secondary | ICD-10-CM | POA: Diagnosis not present

## 2023-10-20 ENCOUNTER — Other Ambulatory Visit: Payer: Self-pay | Admitting: Family Medicine

## 2023-10-20 DIAGNOSIS — M5030 Other cervical disc degeneration, unspecified cervical region: Secondary | ICD-10-CM

## 2023-11-22 ENCOUNTER — Inpatient Hospital Stay: Admission: RE | Admit: 2023-11-22 | Payer: Medicare Other | Source: Ambulatory Visit

## 2023-12-28 ENCOUNTER — Other Ambulatory Visit: Payer: Medicare Other

## 2024-01-09 ENCOUNTER — Ambulatory Visit
Admission: RE | Admit: 2024-01-09 | Discharge: 2024-01-09 | Disposition: A | Discharge status: Home / Self Care | Payer: Medicare Other | Source: Ambulatory Visit | Attending: Family Medicine | Admitting: Family Medicine

## 2024-01-09 DIAGNOSIS — M47812 Spondylosis without myelopathy or radiculopathy, cervical region: Secondary | ICD-10-CM | POA: Diagnosis not present

## 2024-01-09 DIAGNOSIS — M4802 Spinal stenosis, cervical region: Secondary | ICD-10-CM | POA: Diagnosis not present

## 2024-01-09 DIAGNOSIS — M5030 Other cervical disc degeneration, unspecified cervical region: Secondary | ICD-10-CM

## 2024-01-09 DIAGNOSIS — Z981 Arthrodesis status: Secondary | ICD-10-CM | POA: Diagnosis not present

## 2024-01-29 DIAGNOSIS — G959 Disease of spinal cord, unspecified: Secondary | ICD-10-CM | POA: Diagnosis not present

## 2024-02-11 ENCOUNTER — Other Ambulatory Visit: Payer: Self-pay | Admitting: Orthopedic Surgery

## 2024-03-01 NOTE — Progress Notes (Signed)
 Surgical Instructions   Your procedure is scheduled on Thursday, April 10th, 2025. Report to [facility] Main Entrance "A" at 11:00 A.M., then check in with the Admitting office. Any questions or running late day of surgery: call [phone]  Questions prior to your surgery date: call [phone], Monday-Friday, 8am-4pm. If you experience any cold or flu symptoms such as cough, fever, chills, shortness of breath, etc. between now and your scheduled surgery, please notify us at the above number.     Remember:  Do not eat after midnight the night before your surgery  You may drink clear liquids until 11:00 the morning of your surgery.   Clear liquids allowed are: Water, Non-Citrus Juices (without pulp), Carbonated Beverages, Clear Tea (no milk, honey, etc.), Black Coffee Only (NO MILK, CREAM OR POWDERED CREAMER of any kind), and Gatorade.  Patient Instructions  The night before surgery:  No food after midnight. ONLY clear liquids after midnight  The day of surgery (if you do NOT have diabetes):  Drink ONE (1) Pre-Surgery Clear Ensure by 11:00 the morning of surgery. Drink in one sitting. Do not sip.  This drink was given to you during your hospital  pre-op appointment visit.  Nothing else to drink after completing the  Pre-Surgery Clear Ensure.          If you have questions, please contact your surgeon's office.     Take these medicines the morning of surgery with A SIP OF WATER: Bupropion (Wellbutrin) Sertraline (Zoloft)   May take these medicines IF NEEDED: Methocarbamol (Robaxin) Promethazine (Phenergan) Eye drops    One week prior to surgery, STOP taking any Aspirin (unless otherwise instructed by your surgeon) Aleve, Naproxen, Ibuprofen, Motrin, Advil, Goody's, BC's, all herbal medications, fish oil, and non-prescription vitamins.                       Do NOT Smoke (Tobacco/Vaping) for 24 hours prior to your procedure.  If you use a CPAP at night, you may bring  your mask/headgear for your overnight stay.   You will be asked to remove any contacts, glasses, piercing's, hearing aids, dentures/partials prior to surgery. Please bring cases for these items if needed.    Patients discharged the day of surgery will not be allowed to drive home, and someone needs to stay with them for 24 hours.  SURGICAL WAITING ROOM VISITATION Patients may have no more than 2 support people in the waiting area - these visitors may rotate.   Pre-op nurse will coordinate an appropriate time for 1 ADULT support person, who may not rotate, to accompany patient in pre-op.  Children under the age of 7 must have an adult with them who is not the patient and must remain in the main waiting area with an adult.  If the patient needs to stay at the hospital during part of their recovery, the visitor guidelines for inpatient rooms apply.  Please refer to the [facility] website for the visitor guidelines for any additional information.   If you received a COVID test during your pre-op visit  it is requested that you wear a mask when out in public, stay away from anyone that may not be feeling well and notify your surgeon if you develop symptoms. If you have been in contact with anyone that has tested positive in the last 10 days please notify you surgeon.      Pre-operative 5 CHG Bathing Instructions   You can play a key  role in reducing the risk of infection after surgery. Your skin needs to be as free of germs as possible. You can reduce the number of germs on your skin by washing with CHG (chlorhexidine gluconate) soap before surgery. CHG is an antiseptic soap that kills germs and continues to kill germs even after washing.   DO NOT use if you have an allergy to chlorhexidine/CHG or antibacterial soaps. If your skin becomes reddened or irritated, stop using the CHG and notify one of our RNs at [phone].   Please shower with the CHG soap starting 4 days before surgery using  the following schedule:     Please keep in mind the following:  DO NOT shave, including legs and underarms, starting the day of your first shower.   You may shave your face at any point before/day of surgery.  Place clean sheets on your bed the day you start using CHG soap. Use a clean washcloth (not used since being washed) for each shower. DO NOT sleep with pets once you start using the CHG.   CHG Shower Instructions:  Wash your face and private area with normal soap. If you choose to wash your hair, wash first with your normal shampoo.  After you use shampoo/soap, rinse your hair and body thoroughly to remove shampoo/soap residue.  Turn the water OFF and apply about 3 tablespoons (45 ml) of CHG soap to a CLEAN washcloth.  Apply CHG soap ONLY FROM YOUR NECK DOWN TO YOUR TOES (washing for 3-5 minutes)  DO NOT use CHG soap on face, private areas, open wounds, or sores.  Pay special attention to the area where your surgery is being performed.  If you are having back surgery, having someone wash your back for you may be helpful. Wait 2 minutes after CHG soap is applied, then you may rinse off the CHG soap.  Pat dry with a clean towel  Put on clean clothes/pajamas   If you choose to wear lotion, please use ONLY the CHG-compatible lotions that are listed below.  Additional instructions for the day of surgery: DO NOT APPLY any lotions, deodorants, cologne, or perfumes.   Do not bring valuables to the hospital. [facility] is not responsible for any belongings/valuables. Do not wear nail polish, gel polish, artificial nails, or any other type of covering on natural nails (fingers and toes) Do not wear jewelry or makeup Put on clean/comfortable clothes.  Please brush your teeth.  Ask your nurse before applying any prescription medications to the skin.     CHG Compatible Lotions   Aveeno Moisturizing lotion  Cetaphil Moisturizing Cream  Cetaphil Moisturizing Lotion  Clairol Herbal  Essence Moisturizing Lotion, Dry Skin  Clairol Herbal Essence Moisturizing Lotion, Extra Dry Skin  Clairol Herbal Essence Moisturizing Lotion, Normal Skin  Curel Age Defying Therapeutic Moisturizing Lotion with Alpha Hydroxy  Curel Extreme Care Body Lotion  Curel Soothing Hands Moisturizing Hand Lotion  Curel Therapeutic Moisturizing Cream, Fragrance-Free  Curel Therapeutic Moisturizing Lotion, Fragrance-Free  Curel Therapeutic Moisturizing Lotion, Original Formula  Eucerin Daily Replenishing Lotion  Eucerin Dry Skin Therapy Plus Alpha Hydroxy Crme  Eucerin Dry Skin Therapy Plus Alpha Hydroxy Lotion  Eucerin Original Crme  Eucerin Original Lotion  Eucerin Plus Crme Eucerin Plus Lotion  Eucerin TriLipid Replenishing Lotion  Keri Anti-Bacterial Hand Lotion  Keri Deep Conditioning Original Lotion Dry Skin Formula Softly Scented  Keri Deep Conditioning Original Lotion, Fragrance Free Sensitive Skin Formula  Keri Lotion Fast Absorbing Fragrance Free Sensitive Skin Formula  Keri Lotion Fast Absorbing Softly Scented Dry Skin Formula  Keri Original Lotion  Keri Skin Renewal Lotion Keri Silky Smooth Lotion  Keri Silky Smooth Sensitive Skin Lotion  Nivea Body Creamy Conditioning Patent examiner Moisturizing Lotion Nivea Crme  Nivea Skin Firming Lotion  NutraDerm 30 Skin Lotion  NutraDerm Skin Lotion  NutraDerm Therapeutic Skin Cream  NutraDerm Therapeutic Skin Lotion  ProShield Protective Hand Cream  Provon moisturizing lotion  Please read over the following fact sheets that you were given.

## 2024-03-02 ENCOUNTER — Other Ambulatory Visit: Payer: Self-pay

## 2024-03-02 ENCOUNTER — Encounter (HOSPITAL_COMMUNITY): Payer: Self-pay

## 2024-03-02 ENCOUNTER — Encounter (HOSPITAL_COMMUNITY)
Admission: RE | Admit: 2024-03-02 | Discharge: 2024-03-02 | Disposition: A | Discharge status: Home / Self Care | Source: Ambulatory Visit | Attending: Orthopedic Surgery | Admitting: Orthopedic Surgery

## 2024-03-02 VITALS — BP 145/88 | HR 84 | Temp 97.9°F | Resp 17 | Ht 61.25 in | Wt 121.0 lb

## 2024-03-02 DIAGNOSIS — Z01812 Encounter for preprocedural laboratory examination: Secondary | ICD-10-CM | POA: Diagnosis present

## 2024-03-02 DIAGNOSIS — Z01810 Encounter for preprocedural cardiovascular examination: Secondary | ICD-10-CM | POA: Diagnosis present

## 2024-03-02 DIAGNOSIS — Z01818 Encounter for other preprocedural examination: Secondary | ICD-10-CM | POA: Diagnosis not present

## 2024-03-02 LAB — SURGICAL PCR SCREEN
MRSA, PCR: NEGATIVE
Staphylococcus aureus: NEGATIVE

## 2024-03-02 LAB — CBC
HCT: 39.8 % (ref 36.0–46.0)
Hemoglobin: 13 g/dL (ref 12.0–15.0)
MCH: 30.6 pg (ref 26.0–34.0)
MCHC: 32.7 g/dL (ref 30.0–36.0)
MCV: 93.6 fL (ref 80.0–100.0)
Platelets: 263 10*3/uL (ref 150–400)
RBC: 4.25 MIL/uL (ref 3.87–5.11)
RDW: 12.6 % (ref 11.5–15.5)
WBC: 4.8 10*3/uL (ref 4.0–10.5)
nRBC: 0 % (ref 0.0–0.2)

## 2024-03-02 LAB — BASIC METABOLIC PANEL WITH GFR
Anion gap: 10 (ref 5–15)
BUN: 20 mg/dL (ref 8–23)
CO2: 25 mmol/L (ref 22–32)
Calcium: 9.5 mg/dL (ref 8.9–10.3)
Chloride: 103 mmol/L (ref 98–111)
Creatinine, Ser: 0.74 mg/dL (ref 0.44–1.00)
GFR, Estimated: >60 mL/min (ref >60)
Glucose, Bld: 117 mg/dL — ABNORMAL HIGH (ref 70–99)
Potassium: 3.7 mmol/L (ref 3.5–5.1)
Sodium: 138 mmol/L (ref 135–145)

## 2024-03-02 LAB — TYPE AND SCREEN
ABO/RH(D): O NEG
Antibody Screen: NEGATIVE

## 2024-03-02 NOTE — Progress Notes (Signed)
 PCP - Laurann Montana Cardiologist - denies  PPM/ICD - denies Device Orders - n/a Rep Notified - n/a  Chest x-ray - denies EKG - 03/02/24 Stress Test - 06/19/15 ECHO - denies Cardiac Cath - denies  Sleep Study - denies  No DM  Last dose of GLP1 agonist-  n/a GLP1 instructions: n/a  Blood Thinner Instructions: n/a Aspirin Instructions: n/a  ERAS Protcol - clears until 1100 PRE-SURGERY Ensure or G2-  Ensure as ordered  COVID TEST- n/a   Anesthesia review: no  Patient denies shortness of breath, fever, cough and chest pain at PAT appointment   All instructions explained to the patient, with a verbal understanding of the material. Patient agrees to go over the instructions while at home for a better understanding. Patient also instructed to self quarantine after being tested for COVID-19. The opportunity to ask questions was provided.

## 2024-03-08 DIAGNOSIS — G959 Disease of spinal cord, unspecified: Secondary | ICD-10-CM | POA: Diagnosis not present

## 2024-03-10 ENCOUNTER — Ambulatory Visit (HOSPITAL_COMMUNITY)

## 2024-03-10 ENCOUNTER — Encounter (HOSPITAL_COMMUNITY): Payer: Self-pay | Admitting: Orthopedic Surgery

## 2024-03-10 ENCOUNTER — Other Ambulatory Visit: Payer: Self-pay

## 2024-03-10 ENCOUNTER — Ambulatory Visit (HOSPITAL_COMMUNITY): Admitting: Certified Registered Nurse Anesthetist

## 2024-03-10 ENCOUNTER — Other Ambulatory Visit (HOSPITAL_COMMUNITY): Payer: Self-pay

## 2024-03-10 ENCOUNTER — Ambulatory Visit (HOSPITAL_COMMUNITY)
Admission: RE | Admit: 2024-03-10 | Discharge: 2024-03-10 | Disposition: A | Discharge status: Home / Self Care | Attending: Orthopedic Surgery | Admitting: Orthopedic Surgery

## 2024-03-10 ENCOUNTER — Ambulatory Visit (HOSPITAL_COMMUNITY)
Admission: RE | Disposition: A | Discharge status: Home / Self Care | Payer: Self-pay | Source: Home / Self Care | Attending: Orthopedic Surgery

## 2024-03-10 ENCOUNTER — Ambulatory Visit (HOSPITAL_BASED_OUTPATIENT_CLINIC_OR_DEPARTMENT_OTHER): Admitting: Certified Registered Nurse Anesthetist

## 2024-03-10 DIAGNOSIS — Z79899 Other long term (current) drug therapy: Secondary | ICD-10-CM | POA: Diagnosis not present

## 2024-03-10 DIAGNOSIS — M797 Fibromyalgia: Secondary | ICD-10-CM | POA: Insufficient documentation

## 2024-03-10 DIAGNOSIS — F909 Attention-deficit hyperactivity disorder, unspecified type: Secondary | ICD-10-CM | POA: Insufficient documentation

## 2024-03-10 DIAGNOSIS — K219 Gastro-esophageal reflux disease without esophagitis: Secondary | ICD-10-CM | POA: Diagnosis not present

## 2024-03-10 DIAGNOSIS — M4802 Spinal stenosis, cervical region: Secondary | ICD-10-CM | POA: Insufficient documentation

## 2024-03-10 DIAGNOSIS — G9520 Unspecified cord compression: Secondary | ICD-10-CM | POA: Insufficient documentation

## 2024-03-10 DIAGNOSIS — M1990 Unspecified osteoarthritis, unspecified site: Secondary | ICD-10-CM | POA: Insufficient documentation

## 2024-03-10 DIAGNOSIS — F419 Anxiety disorder, unspecified: Secondary | ICD-10-CM | POA: Diagnosis not present

## 2024-03-10 DIAGNOSIS — Z8249 Family history of ischemic heart disease and other diseases of the circulatory system: Secondary | ICD-10-CM | POA: Diagnosis not present

## 2024-03-10 DIAGNOSIS — G9589 Other specified diseases of spinal cord: Secondary | ICD-10-CM | POA: Diagnosis present

## 2024-03-10 DIAGNOSIS — F32A Depression, unspecified: Secondary | ICD-10-CM | POA: Diagnosis not present

## 2024-03-10 DIAGNOSIS — G992 Myelopathy in diseases classified elsewhere: Secondary | ICD-10-CM | POA: Insufficient documentation

## 2024-03-10 DIAGNOSIS — M50021 Cervical disc disorder at C4-C5 level with myelopathy: Secondary | ICD-10-CM | POA: Diagnosis not present

## 2024-03-10 DIAGNOSIS — I10 Essential (primary) hypertension: Secondary | ICD-10-CM | POA: Insufficient documentation

## 2024-03-10 DIAGNOSIS — Z981 Arthrodesis status: Secondary | ICD-10-CM | POA: Insufficient documentation

## 2024-03-10 DIAGNOSIS — G959 Disease of spinal cord, unspecified: Secondary | ICD-10-CM

## 2024-03-10 DIAGNOSIS — Z472 Encounter for removal of internal fixation device: Secondary | ICD-10-CM | POA: Diagnosis not present

## 2024-03-10 HISTORY — PX: ANTERIOR CERVICAL DECOMP/DISCECTOMY FUSION: SHX1161

## 2024-03-10 SURGERY — ANTERIOR CERVICAL DECOMPRESSION/DISCECTOMY FUSION 2 LEVELS
Anesthesia: General | Site: Spine Cervical

## 2024-03-10 MED ORDER — METHOCARBAMOL 500 MG PO TABS
500.0000 mg | ORAL_TABLET | Freq: Four times a day (QID) | ORAL | 0 refills | Status: AC | PRN
  Filled 2024-03-10: qty 60, 8d supply, fill #0

## 2024-03-10 MED ORDER — OXYCODONE HCL 5 MG/5ML PO SOLN: 5.0000 mg | Freq: Once | ORAL | Status: AC | PRN

## 2024-03-10 MED ORDER — ONDANSETRON HCL 4 MG/2ML IJ SOLN
INTRAMUSCULAR | Status: AC
Start: 2024-03-10 — End: ?
  Filled 2024-03-10: qty 2

## 2024-03-10 MED ORDER — PROPOFOL 10 MG/ML IV BOLUS
INTRAVENOUS | Status: DC | PRN
  Administered 2024-03-10: 100 mg via INTRAVENOUS

## 2024-03-10 MED ORDER — 0.9 % SODIUM CHLORIDE (POUR BTL) OPTIME
TOPICAL | Status: DC | PRN
  Administered 2024-03-10: 1000 mL

## 2024-03-10 MED ORDER — OXYCODONE HCL 5 MG PO TABS
ORAL_TABLET | ORAL | Status: DC
Start: 2024-03-10 — End: 2024-03-10
  Filled 2024-03-10: qty 1

## 2024-03-10 MED ORDER — LACTATED RINGERS IV SOLN: INTRAVENOUS | Status: DC

## 2024-03-10 MED ORDER — FENTANYL CITRATE (PF) 100 MCG/2ML IJ SOLN
INTRAMUSCULAR | Status: AC
  Filled 2024-03-10: qty 2

## 2024-03-10 MED ORDER — THROMBIN 20000 UNITS EX SOLR
CUTANEOUS | Status: DC | PRN
  Administered 2024-03-10: 20000 [IU] via TOPICAL

## 2024-03-10 MED ORDER — ALBUMIN HUMAN 5 % IV SOLN: INTRAVENOUS | Status: DC | PRN

## 2024-03-10 MED ORDER — LIDOCAINE 2% (20 MG/ML) 5 ML SYRINGE
INTRAMUSCULAR | Status: AC
  Filled 2024-03-10: qty 5

## 2024-03-10 MED ORDER — LIDOCAINE 2% (20 MG/ML) 5 ML SYRINGE
INTRAMUSCULAR | Status: DC | PRN
  Administered 2024-03-10: 60 mg via INTRAVENOUS

## 2024-03-10 MED ORDER — PHENYLEPHRINE HCL-NACL 20-0.9 MG/250ML-% IV SOLN
INTRAVENOUS | Status: DC | PRN
  Administered 2024-03-10: 50 ug/min via INTRAVENOUS

## 2024-03-10 MED ORDER — ACETAMINOPHEN 500 MG PO TABS
ORAL_TABLET | ORAL | Status: AC
  Administered 2024-03-10: 1000 mg via ORAL
  Filled 2024-03-10: qty 2

## 2024-03-10 MED ORDER — PROPOFOL 10 MG/ML IV BOLUS
INTRAVENOUS | Status: AC
  Filled 2024-03-10: qty 20

## 2024-03-10 MED ORDER — ROCURONIUM BROMIDE 100 MG/10ML IV SOLN
INTRAVENOUS | Status: DC | PRN
  Administered 2024-03-10: 20 mg via INTRAVENOUS
  Administered 2024-03-10: 50 mg via INTRAVENOUS
  Administered 2024-03-10 (×2): 10 mg via INTRAVENOUS

## 2024-03-10 MED ORDER — ONDANSETRON HCL 4 MG/2ML IJ SOLN
INTRAMUSCULAR | Status: DC | PRN
  Administered 2024-03-10: 4 mg via INTRAVENOUS

## 2024-03-10 MED ORDER — OXYCODONE HCL 5 MG PO TABS
5.0000 mg | ORAL_TABLET | Freq: Once | ORAL | Status: AC | PRN
  Administered 2024-03-10: 5 mg via ORAL

## 2024-03-10 MED ORDER — POVIDONE-IODINE 7.5 % EX SOLN
Freq: Once | CUTANEOUS | Status: DC
  Filled 2024-03-10: qty 118

## 2024-03-10 MED ORDER — DEXAMETHASONE SODIUM PHOSPHATE 10 MG/ML IJ SOLN
INTRAMUSCULAR | Status: DC | PRN
  Administered 2024-03-10: 8 mg via INTRAVENOUS

## 2024-03-10 MED ORDER — CEFAZOLIN SODIUM-DEXTROSE 2-4 GM/100ML-% IV SOLN
2.0000 g | INTRAVENOUS | Status: AC
  Administered 2024-03-10: 2 g via INTRAVENOUS

## 2024-03-10 MED ORDER — AMISULPRIDE (ANTIEMETIC) 5 MG/2ML IV SOLN: 10.0000 mg | Freq: Once | INTRAVENOUS | Status: DC | PRN

## 2024-03-10 MED ORDER — BUPIVACAINE-EPINEPHRINE 0.25% -1:200000 IJ SOLN
INTRAMUSCULAR | Status: DC | PRN
  Administered 2024-03-10: 6 mL

## 2024-03-10 MED ORDER — ORAL CARE MOUTH RINSE: 15.0000 mL | Freq: Once | OROMUCOSAL | Status: AC

## 2024-03-10 MED ORDER — DEXAMETHASONE SODIUM PHOSPHATE 10 MG/ML IJ SOLN
INTRAMUSCULAR | Status: AC
  Filled 2024-03-10: qty 1

## 2024-03-10 MED ORDER — CHLORHEXIDINE GLUCONATE 0.12 % MT SOLN
OROMUCOSAL | Status: AC
  Administered 2024-03-10: 15 mL via OROMUCOSAL
  Filled 2024-03-10: qty 15

## 2024-03-10 MED ORDER — ROCURONIUM BROMIDE 10 MG/ML (PF) SYRINGE
PREFILLED_SYRINGE | INTRAVENOUS | Status: AC
  Filled 2024-03-10: qty 10

## 2024-03-10 MED ORDER — SUGAMMADEX SODIUM 200 MG/2ML IV SOLN
INTRAVENOUS | Status: DC | PRN
  Administered 2024-03-10 (×2): 100 mg via INTRAVENOUS

## 2024-03-10 MED ORDER — PHENYLEPHRINE HCL (PRESSORS) 10 MG/ML IV SOLN
INTRAVENOUS | Status: DC | PRN
  Administered 2024-03-10 (×2): 80 ug via INTRAVENOUS

## 2024-03-10 MED ORDER — FENTANYL CITRATE (PF) 100 MCG/2ML IJ SOLN
25.0000 ug | INTRAMUSCULAR | Status: DC | PRN
  Administered 2024-03-10: 50 ug via INTRAVENOUS

## 2024-03-10 MED ORDER — CEFAZOLIN SODIUM-DEXTROSE 2-4 GM/100ML-% IV SOLN
INTRAVENOUS | Status: AC
  Filled 2024-03-10: qty 100

## 2024-03-10 MED ORDER — MIDAZOLAM HCL 2 MG/2ML IJ SOLN
INTRAMUSCULAR | Status: DC | PRN
  Administered 2024-03-10: 2 mg via INTRAVENOUS

## 2024-03-10 MED ORDER — ACETAMINOPHEN 500 MG PO TABS: 1000.0000 mg | ORAL_TABLET | Freq: Once | ORAL | Status: AC

## 2024-03-10 MED ORDER — CHLORHEXIDINE GLUCONATE 0.12 % MT SOLN: 15.0000 mL | Freq: Once | OROMUCOSAL | Status: AC

## 2024-03-10 MED ORDER — MIDAZOLAM HCL 2 MG/2ML IJ SOLN
INTRAMUSCULAR | Status: AC
Start: 2024-03-10 — End: ?
  Filled 2024-03-10: qty 2

## 2024-03-10 MED ORDER — THROMBIN 20000 UNITS EX KIT
PACK | CUTANEOUS | Status: AC
  Filled 2024-03-10: qty 1

## 2024-03-10 MED ORDER — FENTANYL CITRATE (PF) 250 MCG/5ML IJ SOLN
INTRAMUSCULAR | Status: AC
  Filled 2024-03-10: qty 5

## 2024-03-10 MED ORDER — HYDROCODONE-ACETAMINOPHEN 5-325 MG PO TABS
1.0000 | ORAL_TABLET | Freq: Four times a day (QID) | ORAL | 0 refills | Status: AC | PRN
  Filled 2024-03-10: qty 20, 5d supply, fill #0

## 2024-03-10 MED ORDER — FENTANYL CITRATE (PF) 100 MCG/2ML IJ SOLN
INTRAMUSCULAR | Status: DC | PRN
Start: 2024-03-10 — End: 2024-03-10
  Administered 2024-03-10 (×3): 50 ug via INTRAVENOUS

## 2024-03-10 MED ORDER — BUPIVACAINE-EPINEPHRINE (PF) 0.25% -1:200000 IJ SOLN
INTRAMUSCULAR | Status: AC
  Filled 2024-03-10: qty 30

## 2024-03-10 SURGICAL SUPPLY — 68 items
BAG COUNTER SPONGE SURGICOUNT (BAG) ×1 IMPLANT
BENZOIN TINCTURE PRP APPL 2/3 (GAUZE/BANDAGES/DRESSINGS) ×1 IMPLANT
BIT DRILL NEURO 2X3.1 SFT TUCH (MISCELLANEOUS) ×1 IMPLANT
BIT DRILL SRG 14X2.2XFLT CHK (BIT) IMPLANT
BIT DRL SRG 14X2.2XFLT CHK (BIT) ×1 IMPLANT
BLADE CLIPPER SURG (BLADE) ×1 IMPLANT
BLADE SURG 15 STRL LF DISP TIS (BLADE) ×1 IMPLANT
BONE VIVIGEN FORMABLE 1.3CC (Bone Implant) ×1 IMPLANT
CAGE LORDOTIC TC SM 8 6D (Cage) IMPLANT
COLLAR CERV LO CONTOUR FIRM DE (SOFTGOODS) IMPLANT
CORD BIPOLAR FORCEPS 12FT (ELECTRODE) ×1 IMPLANT
COVER SURGICAL LIGHT HANDLE (MISCELLANEOUS) ×1 IMPLANT
DEVICE ENDSKLTN IMPLANT SM 7MM (Cage) IMPLANT
DRAIN JACKSON RD 7FR 3/32 (WOUND CARE) IMPLANT
DRAPE C-ARM 42X72 X-RAY (DRAPES) ×1 IMPLANT
DRAPE POUCH INSTRU U-SHP 10X18 (DRAPES) ×1 IMPLANT
DRAPE SURG 17X23 STRL (DRAPES) ×4 IMPLANT
DRILL NEURO 2X3.1 SOFT TOUCH (MISCELLANEOUS) ×1 IMPLANT
DURAPREP 26ML APPLICATOR (WOUND CARE) ×1 IMPLANT
ELECT COATED BLADE 2.86 ST (ELECTRODE) ×1 IMPLANT
ELECT REM PT RETURN 9FT ADLT (ELECTROSURGICAL) ×1 IMPLANT
ELECTRODE REM PT RTRN 9FT ADLT (ELECTROSURGICAL) ×1 IMPLANT
ENDOSKELETON IMPLANT SM 7MM (Cage) ×1 IMPLANT
EVACUATOR SILICONE 100CC (DRAIN) IMPLANT
GAUZE 4X4 16PLY ~~LOC~~+RFID DBL (SPONGE) ×1 IMPLANT
GAUZE SPONGE 4X4 12PLY STRL (GAUZE/BANDAGES/DRESSINGS) ×1 IMPLANT
GLOVE BIO SURGEON STRL SZ 6.5 (GLOVE) ×1 IMPLANT
GLOVE BIO SURGEON STRL SZ8 (GLOVE) ×1 IMPLANT
GLOVE BIOGEL PI IND STRL 7.0 (GLOVE) ×2 IMPLANT
GLOVE BIOGEL PI IND STRL 8 (GLOVE) ×1 IMPLANT
GLOVE SURG ENC MOIS LTX SZ6.5 (GLOVE) ×1 IMPLANT
GOWN STRL REUS W/ TWL LRG LVL3 (GOWN DISPOSABLE) ×1 IMPLANT
GOWN STRL REUS W/ TWL XL LVL3 (GOWN DISPOSABLE) ×1 IMPLANT
GRAFT BNE MATRIX VG FRMBL SM 1 (Bone Implant) IMPLANT
IV CATH 14GX2 1/4 (CATHETERS) ×1 IMPLANT
KIT BASIN OR (CUSTOM PROCEDURE TRAY) ×1 IMPLANT
KIT TURNOVER KIT B (KITS) ×1 IMPLANT
MANIFOLD NEPTUNE II (INSTRUMENTS) ×1 IMPLANT
NDL PRECISIONGLIDE 27X1.5 (NEEDLE) ×1 IMPLANT
NDL SPNL 20GX3.5 QUINCKE YW (NEEDLE) ×1 IMPLANT
NEEDLE PRECISIONGLIDE 27X1.5 (NEEDLE) ×1 IMPLANT
NEEDLE SPNL 20GX3.5 QUINCKE YW (NEEDLE) ×1 IMPLANT
NS IRRIG 1000ML POUR BTL (IV SOLUTION) ×1 IMPLANT
PACK ORTHO CERVICAL (CUSTOM PROCEDURE TRAY) ×1 IMPLANT
PAD ARMBOARD POSITIONER FOAM (MISCELLANEOUS) ×2 IMPLANT
PATTIES SURGICAL .5 X.5 (GAUZE/BANDAGES/DRESSINGS) IMPLANT
PATTIES SURGICAL .5 X1 (DISPOSABLE) ×1 IMPLANT
PIN DISTRACTION 14 (PIN) IMPLANT
PLATE SKYLINE TWO LEVEL 32MM (Plate) IMPLANT
POSITIONER HEAD DONUT 9IN (MISCELLANEOUS) ×1 IMPLANT
SCREW SKYLINE VAR OS 14MM (Screw) IMPLANT
SPIKE FLUID TRANSFER (MISCELLANEOUS) ×1 IMPLANT
SPONGE INTESTINAL PEANUT (DISPOSABLE) ×1 IMPLANT
SPONGE SURGIFOAM ABS GEL 100 (HEMOSTASIS) ×1 IMPLANT
STRIP CLOSURE SKIN 1/2X4 (GAUZE/BANDAGES/DRESSINGS) ×1 IMPLANT
SURGIFLO W/THROMBIN 8M KIT (HEMOSTASIS) IMPLANT
SUT MNCRL AB 4-0 PS2 18 (SUTURE) ×1 IMPLANT
SUT SILK 4-0 18XBRD TIE 12 (SUTURE) IMPLANT
SUT VIC AB 2-0 CT2 18 VCP726D (SUTURE) ×1 IMPLANT
SYR BULB IRRIG 60ML STRL (SYRINGE) ×1 IMPLANT
SYR CONTROL 10ML LL (SYRINGE) ×3 IMPLANT
TAPE CLOTH 4X10 WHT NS (GAUZE/BANDAGES/DRESSINGS) ×1 IMPLANT
TAPE CLOTH SOFT 2X10 (GAUZE/BANDAGES/DRESSINGS) IMPLANT
TAPE UMBILICAL 1/8X30 (MISCELLANEOUS) ×2 IMPLANT
TOWEL GREEN STERILE (TOWEL DISPOSABLE) ×1 IMPLANT
TOWEL GREEN STERILE FF (TOWEL DISPOSABLE) ×1 IMPLANT
WATER STERILE IRR 1000ML POUR (IV SOLUTION) ×1 IMPLANT
YANKAUER SUCT BULB TIP NO VENT (SUCTIONS) ×1 IMPLANT

## 2024-03-10 NOTE — Anesthesia Procedure Notes (Signed)
 Procedure Name: Intubation Date/Time: 03/10/2024 1:39 PM  Performed by: Ovidio Kin, CRNAPre-anesthesia Checklist: Patient identified, Emergency Drugs available, Suction available and Patient being monitored Patient Re-evaluated:Patient Re-evaluated prior to induction Oxygen Delivery Method: Circle system utilized Preoxygenation: Pre-oxygenation with 100% oxygen Induction Type: IV induction Ventilation: Mask ventilation without difficulty Laryngoscope Size: Glidescope and 3 Grade View: Grade I Tube type: Oral Tube size: 7.0 mm Number of attempts: 1 Airway Equipment and Method: Stylet Placement Confirmation: ETT inserted through vocal cords under direct vision, positive ETCO2, CO2 detector and breath sounds checked- equal and bilateral Secured at: 20 cm Tube secured with: Tape Dental Injury: Teeth and Oropharynx as per pre-operative assessment

## 2024-03-10 NOTE — Anesthesia Preprocedure Evaluation (Addendum)
 Anesthesia Evaluation  Patient identified by MRN, date of birth, ID band Patient awake    Reviewed: Allergy & Precautions, NPO status , Patient's Chart, lab work & pertinent test results  Airway Mallampati: I  TM Distance: >3 FB Neck ROM: Full    Dental  (+) Dental Advisory Given, Teeth Intact   Pulmonary neg pulmonary ROS   Pulmonary exam normal breath sounds clear to auscultation       Cardiovascular hypertension, Pt. on medications Normal cardiovascular exam Rhythm:Regular Rate:Normal     Neuro/Psych  PSYCHIATRIC DISORDERS Anxiety Depression    negative neurological ROS     GI/Hepatic Neg liver ROS,GERD  ,,  Endo/Other  negative endocrine ROS    Renal/GU negative Renal ROS  negative genitourinary   Musculoskeletal  (+) Arthritis ,  Fibromyalgia -  Abdominal   Peds  (+) ADHD Hematology negative hematology ROS (+)   Anesthesia Other Findings   Reproductive/Obstetrics                             Anesthesia Physical Anesthesia Plan  ASA: 2  Anesthesia Plan: General   Post-op Pain Management: Tylenol PO (pre-op)* and Dilaudid IV   Induction: Intravenous  PONV Risk Score and Plan: 3 and Midazolam, Dexamethasone and Ondansetron  Airway Management Planned: Oral ETT and Video Laryngoscope Planned  Additional Equipment:   Intra-op Plan:   Post-operative Plan: Extubation in OR  Informed Consent: I have reviewed the patients History and Physical, chart, labs and discussed the procedure including the risks, benefits and alternatives for the proposed anesthesia with the patient or authorized representative who has indicated his/her understanding and acceptance.     Dental advisory given  Plan Discussed with: CRNA  Anesthesia Plan Comments:        Anesthesia Quick Evaluation

## 2024-03-10 NOTE — Transfer of Care (Signed)
 Immediate Anesthesia Transfer of Care Note  Patient: Whitney Gregory  Procedure(s) Performed: ANTERIOR CERVICAL DECOMPRESSION FUSION  CERVICAL THREE-CERVICAL FOUR, CERVICAL FOUR-CERVICAL FIVE WITH REMOVAL OF CERVICAL FIVE-CERVICAL SIX PLATES WITH INSTRUMENTATION AND ALLOGRAFT (Spine Cervical)  Patient Location: PACU  Anesthesia Type:General  Level of Consciousness: awake, alert , and oriented  Airway & Oxygen Therapy: Patient Spontanous Breathing and Patient connected to nasal cannula oxygen  Post-op Assessment: Report given to RN and Post -op Vital signs reviewed and stable  Post vital signs: Reviewed and stable  Last Vitals:  Vitals Value Taken Time  BP 130/78 03/10/24 1600  Temp 36.8 C 03/10/24 1557  Pulse 92 03/10/24 1602  Resp 17 03/10/24 1602  SpO2 97 % 03/10/24 1602  Vitals shown include unfiled device data.  Last Pain:  Vitals:   03/10/24 1133  TempSrc:   PainSc: 6          Complications: No notable events documented.

## 2024-03-10 NOTE — Op Note (Signed)
 PATIENT NAME: Whitney Gregory   MEDICAL RECORD NO.:   [id number]    DATE OF BIRTH: 03-Nov-1956   DATE OF PROCEDURE: 03/10/2024                               OPERATIVE REPORT     PREOPERATIVE DIAGNOSES: 1. Progressive cervical myelopathy 2. Spinal stenosis spanning C3-C5 3. Status post previous C5-6 ACDF many years ago   POSTOPERATIVE DIAGNOSES: 1. Progressive cervical myelopathy 2. Spinal stenosis spanning C3-C5 3. Status post previous C5-6 ACDF many years ago   PROCEDURE: 1. Anterior cervical decompression and fusion C3-4, C4-5. 2. Placement of anterior instrumentation, C3-C5 (of note, the anterior plate and screws were separate from, and not integral to the intervertebral spacers) 3. Insertion of interbody device x2 (Titan intervertebral spacers). 4. Exploration of spinal fusion, C5-6 5. Intraoperative use of fluoroscopy. 6. Use of morselized allograft - ViviGen.   SURGEON:  Estill Bamberg, MD   ASSISTANT:  Jason Coop, PA-C.   ANESTHESIA:  General endotracheal anesthesia.   COMPLICATIONS:  None.   DISPOSITION:  Stable.   ESTIMATED BLOOD LOSS:  Minimal.   INDICATIONS FOR SURGERY:  Briefly, Whitney Gregory is a pleasant 68 y.o. -year- old female, who did present to me with symptoms consistent with progressive cervical myelopathy.  The patient's MRI did reveal the findings noted above.  Given the patient's ongoing rather debilitating pain and lack of improvement with appropriate treatment measures, we did discuss proceeding with the procedure noted above.  The patient was fully aware of the risks and limitations of surgery as outlined in my preoperative note.   OPERATIVE DETAILS:  On 03/10/2024 the patient was brought to surgery and general endotracheal anesthesia was administered.  The patient was placed supine on the hospital bed. The neck was gently extended.  All bony prominences were meticulously padded.  The neck was prepped and draped in the usual sterile fashion.   At this point, I did make a left-sided transverse incision.  The platysma was incised.  A Smith-Robinson approach was used and the anterior spine was identified. A self-retaining retractor was placed.  I then subperiosteally exposed the vertebral bodies from C3-C5.  Immediately inferior to this was the previously placed hardware, spanning C5-6.  The screws and plate were removed uneventfully.  I then subperiosteally exposed the fusion construct anteriorly across C5-6.  A Caspar pin was placed into the C5 and C6 vertebral bodies any pushing and pulling maneuver was performed while evaluating the fusion.  In this fashion, the fusion was explored, and there was noted to be a solid and successful fusion across C5-6. At this point, caspar pins were then placed into the C4 and C5 vertebral bodies and distraction was applied.  A thorough and complete C4-5 intervertebral diskectomy was performed.  The posterior longitudinal ligament was identified and entered using a nerve hook.  I then used #1 followed by #2 Kerrison to perform a thorough and complete intervertebral diskectomy.  The spinal canal was thoroughly decompressed, as was the left and right neuroforamen.  The endplates were then prepared and the appropriate-sized intervertebral spacer was then packed with ViviGen and tamped into position in the usual fashion.  The lower Caspar pin was then removed and placed into the C3 vertebral body and once again, distraction was applied across the C3-4 intervertebral space.  I then again performed a thorough and complete diskectomy, thoroughly decompressing the spinal canal and bilateral neuroforamen.  After preparing the endplates, the appropriate-sized intervertebral spacer was packed with ViviGen and tamped into position.  The Caspar pins then were removed and bone wax was placed in their place.  The appropriate-sized anterior cervical plate was placed over the anterior spine.  14 mm variable angle  screws were placed, 2 in each vertebral body from C3-C5 for a total of 6 vertebral body screws.  The screws were then locked to the plate using the Cam locking mechanism.  I was very pleased with the final fluoroscopic images.  The wound was then irrigated.  The wound was then explored for any undue bleeding and there was no bleeding noted. The wound was then closed in layers using 2-0 Vicryl, followed by 4-0 Monocryl.  Benzoin and Steri-Strips were applied, followed by sterile dressing.  All instrument counts were correct at the termination of the procedure.   Of note, Jason Coop, PA-C, was my assistant throughout surgery, and did aid in retraction, suctioning, and closure from start to finish.     Estill Bamberg, MD

## 2024-03-10 NOTE — H&P (Signed)
 PREOPERATIVE H&P  Chief Complaint: Bilateral arm pain, balance deterioration  HPI: Whitney Gregory is a 68 y.o. female who presents with ongoing symptoms as noted above  MRI reveals severe stenosis, including spinal cord compression, spanning C3-C5  Patient has failed multiple forms of conservative care and continues to have pain (see office notes for additional details regarding the patient's full course of treatment)  Past Medical History:  Diagnosis Date   ADHD (attention deficit hyperactivity disorder)    Anemia    hx of    Anxiety    Arthritis    Bilateral leg pain    Cervicalgia    cervical surgery 1999 & 2000   Depression    Family history of adverse reaction to anesthesia    PONV   Fibromyalgia    GERD (gastroesophageal reflux disease)    pt denies   Hypertension    Past Surgical History:  Procedure Laterality Date   ABDOMINAL EXPOSURE N/A 10/29/2022   Procedure: ABDOMINAL EXPOSURE;  Surgeon: Cephus Shelling, MD;  Location: [facility] OR;  Service: Vascular;  Laterality: N/A;   ANTERIOR LAT LUMBAR FUSION Right 10/30/2022   Procedure: RIGHT-SIDED LATERAL INTERBODY FUSION LUMBAR 2- LUMBAR 3, LUMBAR 3- LUMBAR 4 WITH INSTRUMENTATION AND ALLOGRAFT;  Surgeon: Estill Bamberg, MD;  Location: [facility] OR;  Service: Orthopedics;  Laterality: Right;   ANTERIOR LUMBAR FUSION N/A 10/29/2022   Procedure: ANTERIOR LUMBAR INTERBODY FUSION LUMBAR 4- LUMBAR 5, LUMBAR 5 - SACRUM 1 WITH INSTRUMENTATION AND ALLOGRAFT;  Surgeon: Estill Bamberg, MD;  Location: [facility] OR;  Service: Orthopedics;  Laterality: N/A;   DILATION AND CURETTAGE OF UTERUS     KNEE SURGERY Right    torn meniscus repaired   NECK SURGERY     OTHER SURGICAL HISTORY     lapararoscopies for endometriosis    PARTIAL KNEE ARTHROPLASTY  04/27/2012   Procedure: UNICOMPARTMENTAL KNEE;  Surgeon: Shelda Pal, MD;  Location: [facility];  Service: Orthopedics;  Laterality: Left;   POSTERIOR LUMBAR FUSION 4 LEVEL N/A 10/30/2022    Procedure: POSTERIOR DECOMPRESSION FUSION LUMBAR 2- LUMBAR 3, LUMBAR 3- LUMBAR 4, LUMBAR 4- LUMBAR 5, LUMBAR 5, SACRUM 1 WITH INSTRUMENTATION AND ALLOGRAFT;  Surgeon: Estill Bamberg, MD;  Location: [facility] OR;  Service: Orthopedics;  Laterality: N/A;   TONSILLECTOMY     VAGINAL HYSTERECTOMY     still has ovaries   Social History   Socioeconomic History   Marital status: Divorced    Spouse name: Not on file   Number of children: 2   Years of education: Not on file   Highest education level: Not on file  Occupational History   Not on file  Tobacco Use   Smoking status: Never   Smokeless tobacco: Never  Vaping Use   Vaping status: Never Used  Substance and Sexual Activity   Alcohol use: Yes    Alcohol/week: 7.0 standard drinks of alcohol    Types: 7 Standard drinks or equivalent per week    Comment: 1 drink per night -  [employer] Mule   Drug use: Not Currently    Types: Other-see comments    Comment: Delta-8 (vapes at night)   Sexual activity: Not on file  Other Topics Concern   Not on file  Social History Narrative   1 biological, 1 adopted   Social Drivers of Health   Financial Resource Strain: Not on file  Food Insecurity: Not on file  Transportation Needs: Not on file  Physical Activity: Not on  file  Stress: Not on file  Social Connections: Not on file   Family History  Problem Relation Age of Onset   Anesthesia problems Mother    Hypertension Father    Allergies  Allergen Reactions   Duloxetine Hcl     Other Reaction(s): constipation   Escitalopram     Other Reaction(s): no energy   Nefazodone Nausea Only   Prednisone Other (See Comments)    Makes pt feel like "top of head is going to blow off", small doses seem to be tolerable, but pt prefers not to take    Pregabalin     Other Reaction(s): hallucinations   Venlafaxine Nausea Only   Zolpidem     Other Reaction(s): did not like how she felt on it   Prior to Admission medications   Medication Sig Start Date End  Date Taking? Authorizing Provider  amphetamine-dextroamphetamine (ADDERALL XR) 30 MG 24 hr capsule Take 30 mg by mouth every morning. 07/23/22  Yes [provider]  amphetamine-dextroamphetamine (ADDERALL) 30 MG tablet Take 30 mg by mouth in the morning. 10/03/22  Yes [provider]  buPROPion (WELLBUTRIN XL) 300 MG 24 hr tablet Take 300 mg by mouth every morning.   Yes [provider]  CALCIUM PO Take 1 tablet by mouth 2 (two) times a week.   Yes [provider]  MAGNESIUM GLUCONATE PO Take 1 tablet by mouth at bedtime.   Yes [provider]  melatonin 3 MG TABS tablet Take 3 mg by mouth at bedtime.   Yes [provider]  methocarbamol (ROBAXIN) 500 MG tablet Take 1-2 tablets (500-1,000 mg total) by mouth every 6 (six) hours. Patient taking differently: Take 500-1,000 mg by mouth every 6 (six) hours as needed for muscle spasms. 10/31/22  Yes McKenzie, Eilene Ghazi, PA-C  Misc Natural Products (ENERGY SUPPORT PO) Take 1 packet by mouth in the morning. Zipfizz Daily Energy Drink   Yes [provider]  naphazoline-glycerin (CLEAR EYES REDNESS) 0.012-0.25 % SOLN 1-2 drops 4 (four) times daily as needed for eye irritation.   Yes [provider]  promethazine (PHENERGAN) 25 MG tablet Take 25 mg by mouth every 12 (twelve) hours as needed for nausea or vomiting.   Yes [provider]  sertraline (ZOLOFT) 100 MG tablet Take 200 mg by mouth in the morning. 05/17/15  Yes [provider]  temazepam (RESTORIL) 30 MG capsule Take 30 mg by mouth at bedtime.   Yes [provider]  valsartan-hydrochlorothiazide (DIOVAN-HCT) 160-12.5 MG tablet Take 1 tablet by mouth at bedtime. 09/05/22  Yes [provider]  B Complex-C (B-COMPLEX WITH VITAMIN C) tablet Take 1 tablet by mouth at bedtime.    [provider]  celecoxib (CELEBREX) 200 MG capsule Take 200 mg by mouth in the morning.    [provider]      All other systems have been reviewed and were otherwise negative with the exception of those mentioned in the HPI and as above.  Physical Exam: There were no vitals filed for this visit.  There is no height or weight on file to calculate BMI.  General: Alert, no acute distress Cardiovascular: No pedal edema Respiratory: No cyanosis, no use of accessory musculature Skin: No lesions in the area of chief complaint Neurologic: Sensation intact distally Psychiatric: Patient is competent for consent with normal mood and affect Lymphatic: No axillary or cervical lymphadenopathy  Assessment/Plan: CERVICAL MYELOPATHY Plan for Procedure(s): ANTERIOR CERVICAL DECOMPRESSION/DISCECTOMY FUSION, C3/4, C4/5  Jackelyn Hoehn, MD 03/10/2024 7:02 AM

## 2024-03-11 ENCOUNTER — Encounter (HOSPITAL_COMMUNITY): Payer: Self-pay | Admitting: Orthopedic Surgery

## 2024-03-11 MED FILL — Thrombin For Soln Kit 20000 Unit: CUTANEOUS | Qty: 1 | Status: AC

## 2024-03-13 NOTE — Anesthesia Postprocedure Evaluation (Signed)
 Anesthesia Post Note  Patient: Whitney Gregory  Procedure(s) Performed: ANTERIOR CERVICAL DECOMPRESSION FUSION  CERVICAL THREE-CERVICAL FOUR, CERVICAL FOUR-CERVICAL FIVE WITH REMOVAL OF CERVICAL FIVE-CERVICAL SIX PLATES WITH INSTRUMENTATION AND ALLOGRAFT (Spine Cervical)     Patient location during evaluation: PACU Anesthesia Type: General Level of consciousness: awake and alert Pain management: pain level controlled Vital Signs Assessment: post-procedure vital signs reviewed and stable Respiratory status: spontaneous breathing, nonlabored ventilation, respiratory function stable and patient connected to nasal cannula oxygen Cardiovascular status: blood pressure returned to baseline and stable Postop Assessment: no apparent nausea or vomiting Anesthetic complications: no  No notable events documented.  Last Vitals:  Vitals:   03/10/24 1630 03/10/24 1640  BP: 134/69 (!) 147/68  Pulse: 95 83  Resp: 16 12  Temp:  36.8 C  SpO2: 96% 94%    Last Pain:  Vitals:   03/10/24 1640  TempSrc:   PainSc: 2    Pain Goal: Patients Stated Pain Goal: 3 (03/10/24 1600)                 [name] L Tilden Broz

## 2024-03-23 DIAGNOSIS — M4802 Spinal stenosis, cervical region: Secondary | ICD-10-CM | POA: Diagnosis not present

## 2024-04-29 DIAGNOSIS — M4802 Spinal stenosis, cervical region: Secondary | ICD-10-CM | POA: Diagnosis not present

## 2024-05-11 DIAGNOSIS — Z0000 Encounter for general adult medical examination without abnormal findings: Secondary | ICD-10-CM | POA: Diagnosis not present

## 2024-05-11 DIAGNOSIS — M5030 Other cervical disc degeneration, unspecified cervical region: Secondary | ICD-10-CM | POA: Diagnosis not present

## 2024-05-11 DIAGNOSIS — R7303 Prediabetes: Secondary | ICD-10-CM | POA: Diagnosis not present

## 2024-05-11 DIAGNOSIS — M5136 Other intervertebral disc degeneration, lumbar region with discogenic back pain only: Secondary | ICD-10-CM | POA: Diagnosis not present

## 2024-05-11 DIAGNOSIS — F5101 Primary insomnia: Secondary | ICD-10-CM | POA: Diagnosis not present

## 2024-05-11 DIAGNOSIS — M797 Fibromyalgia: Secondary | ICD-10-CM | POA: Diagnosis not present

## 2024-05-11 DIAGNOSIS — M8588 Other specified disorders of bone density and structure, other site: Secondary | ICD-10-CM | POA: Diagnosis not present

## 2024-05-11 DIAGNOSIS — E559 Vitamin D deficiency, unspecified: Secondary | ICD-10-CM | POA: Diagnosis not present

## 2024-05-11 DIAGNOSIS — I10 Essential (primary) hypertension: Secondary | ICD-10-CM | POA: Diagnosis not present

## 2024-05-13 DIAGNOSIS — H5213 Myopia, bilateral: Secondary | ICD-10-CM | POA: Diagnosis not present

## 2024-05-13 DIAGNOSIS — H04123 Dry eye syndrome of bilateral lacrimal glands: Secondary | ICD-10-CM | POA: Diagnosis not present

## 2024-05-13 DIAGNOSIS — H52203 Unspecified astigmatism, bilateral: Secondary | ICD-10-CM | POA: Diagnosis not present

## 2024-08-25 DIAGNOSIS — M25561 Pain in right knee: Secondary | ICD-10-CM | POA: Diagnosis not present

## 2024-09-15 DIAGNOSIS — E785 Hyperlipidemia, unspecified: Secondary | ICD-10-CM | POA: Diagnosis not present

## 2024-09-15 DIAGNOSIS — R6889 Other general symptoms and signs: Secondary | ICD-10-CM | POA: Diagnosis not present

## 2024-09-15 DIAGNOSIS — R634 Abnormal weight loss: Secondary | ICD-10-CM | POA: Diagnosis not present

## 2024-09-15 DIAGNOSIS — R232 Flushing: Secondary | ICD-10-CM | POA: Diagnosis not present

## 2024-09-15 DIAGNOSIS — L680 Hirsutism: Secondary | ICD-10-CM | POA: Diagnosis not present

## 2024-09-15 DIAGNOSIS — M5136 Other intervertebral disc degeneration, lumbar region with discogenic back pain only: Secondary | ICD-10-CM | POA: Diagnosis not present

## 2024-09-15 DIAGNOSIS — I10 Essential (primary) hypertension: Secondary | ICD-10-CM | POA: Diagnosis not present

## 2024-09-15 DIAGNOSIS — R202 Paresthesia of skin: Secondary | ICD-10-CM | POA: Diagnosis not present

## 2024-09-15 DIAGNOSIS — R7303 Prediabetes: Secondary | ICD-10-CM | POA: Diagnosis not present
